# Patient Record
Sex: Female | Born: 2000 | Race: Black or African American | Hispanic: No | Marital: Single | Smoking: Never smoker
Health system: Southern US, Community
[De-identification: ages and names within clinical notes are randomized; demographics above are authoritative.]

## PROBLEM LIST (undated history)

## (undated) DIAGNOSIS — U071 COVID-19: Secondary | ICD-10-CM

## (undated) DIAGNOSIS — J45909 Unspecified asthma, uncomplicated: Secondary | ICD-10-CM

## (undated) DIAGNOSIS — S060XAA Concussion with loss of consciousness status unknown, initial encounter: Secondary | ICD-10-CM

## (undated) DIAGNOSIS — R519 Headache, unspecified: Secondary | ICD-10-CM

## (undated) HISTORY — DX: COVID-19: U07.1

## (undated) HISTORY — DX: Unspecified asthma, uncomplicated: J45.909

## (undated) HISTORY — DX: Headache, unspecified: R51.9

## (undated) HISTORY — DX: Concussion with loss of consciousness status unknown, initial encounter: S06.0XAA

---
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
## 2019-03-03 ENCOUNTER — Other Ambulatory Visit: Payer: Self-pay

## 2019-03-03 DIAGNOSIS — Z20822 Contact with and (suspected) exposure to covid-19: Secondary | ICD-10-CM

## 2019-03-07 LAB — NOVEL CORONAVIRUS, NAA: SARS-CoV-2, NAA: NOT DETECTED

## 2019-03-18 ENCOUNTER — Ambulatory Visit: Payer: Self-pay | Admitting: Physician Assistant

## 2019-03-18 ENCOUNTER — Other Ambulatory Visit: Payer: Self-pay

## 2019-03-18 VITALS — BP 95/65 | HR 66 | Temp 98.3°F | Resp 14 | Ht 65.5 in | Wt 133.0 lb

## 2019-03-18 DIAGNOSIS — Z025 Encounter for examination for participation in sport: Secondary | ICD-10-CM

## 2019-03-18 NOTE — Patient Instructions (Signed)
Thank you for choosing [facility] for your health care needs.  You were seen for a SPORTS PHYSICAL  Paperwork completed.  No restrictions.  Enjoy your season.   Recommend regular follow-up with a family physician for health maintenance. Hope you have a good day.  Health Maintenance, Female Adopting a healthy lifestyle and getting preventive care are important in promoting health and wellness. Ask your health care provider about:  The right schedule for you to have regular tests and exams.  Things you can do on your own to prevent diseases and keep yourself healthy. What should I know about diet, weight, and exercise? Eat a healthy diet   Eat a diet that includes plenty of vegetables, fruits, low-fat dairy products, and lean protein.  Do not eat a lot of foods that are high in solid fats, added sugars, or sodium. Maintain a healthy weight Body mass index (BMI) is used to identify weight problems. It estimates body fat based on height and weight. Your health care provider can help determine your BMI and help you achieve or maintain a healthy weight. Get regular exercise Get regular exercise. This is one of the most important things you can do for your health. Most adults should:  Exercise for at least 150 minutes each week. The exercise should increase your heart rate and make you sweat (moderate-intensity exercise).  Do strengthening exercises at least twice a week. This is in addition to the moderate-intensity exercise.  Spend less time sitting. Even light physical activity can be beneficial. Watch cholesterol and blood lipids Have your blood tested for lipids and cholesterol at 20 years of age, then have this test every 5 years. Have your cholesterol levels checked more often if:  Your lipid or cholesterol levels are high.  You are older than 20 years of age.  You are at high risk for heart disease. What should I know about cancer screening? Depending on your health  history and family history, you may need to have cancer screening at various ages. This may include screening for:  Breast cancer.  Cervical cancer.  Colorectal cancer.  Skin cancer.  Lung cancer. What should I know about heart disease, diabetes, and high blood pressure? Blood pressure and heart disease  High blood pressure causes heart disease and increases the risk of stroke. This is more likely to develop in people who have high blood pressure readings, are of African descent, or are overweight.  Have your blood pressure checked: ? Every 3-5 years if you are 5818-20 years of age. ? Every year if you are 20 years old or older. Diabetes Have regular diabetes screenings. This checks your fasting blood sugar level. Have the screening done:  Once every three years after age 20 if you are at a normal weight and have a low risk for diabetes.  More often and at a younger age if you are overweight or have a high risk for diabetes. What should I know about preventing infection? Hepatitis B If you have a higher risk for hepatitis B, you should be screened for this virus. Talk with your health care provider to find out if you are at risk for hepatitis B infection. Hepatitis C Testing is recommended for:  Everyone born from [birth year]1945 through 1965.  Anyone with known risk factors for hepatitis C. Sexually transmitted infections (STIs)  Get screened for STIs, including gonorrhea and chlamydia, if: ? You are sexually active and are younger than 20 years of age. ? You are older than 20  years of age and your health care provider tells you that you are at risk for this type of infection. ? Your sexual activity has changed since you were last screened, and you are at increased risk for chlamydia or gonorrhea. Ask your health care provider if you are at risk.  Ask your health care provider about whether you are at high risk for HIV. Your health care provider may recommend a prescription medicine to  help prevent HIV infection. If you choose to take medicine to prevent HIV, you should first get tested for HIV. You should then be tested every 3 months for as long as you are taking the medicine. Pregnancy  If you are about to stop having your period (premenopausal) and you may become pregnant, seek counseling before you get pregnant.  Take 400 to 800 micrograms (mcg) of folic acid every day if you become pregnant.  Ask for birth control (contraception) if you want to prevent pregnancy. Osteoporosis and menopause Osteoporosis is a disease in which the bones lose minerals and strength with aging. This can result in bone fractures. If you are 92 years old or older, or if you are at risk for osteoporosis and fractures, ask your health care provider if you should:  Be screened for bone loss.  Take a calcium or vitamin D supplement to lower your risk of fractures.  Be given hormone replacement therapy (HRT) to treat symptoms of menopause. Follow these instructions at home: Lifestyle  Do not use any products that contain nicotine or tobacco, such as cigarettes, e-cigarettes, and chewing tobacco. If you need help quitting, ask your health care provider.  Do not use street drugs.  Do not share needles.  Ask your health care provider for help if you need support or information about quitting drugs. Alcohol use  Do not drink alcohol if: ? Your health care provider tells you not to drink. ? You are pregnant, may be pregnant, or are planning to become pregnant.  If you drink alcohol: ? Limit how much you use to 0-1 drink a day. ? Limit intake if you are breastfeeding.  Be aware of how much alcohol is in your drink. In the [place] one drink equals one 12 oz bottle of beer (355 mL), one 5 oz glass of wine (148 mL), or one 1 oz glass of hard liquor (44 mL). General instructions  Schedule regular health, dental, and eye exams.  Stay current with your vaccines.  Tell your health care  provider if: ? You often feel depressed. ? You have ever been abused or do not feel safe at home. Summary  Adopting a healthy lifestyle and getting preventive care are important in promoting health and wellness.  Follow your health care provider's instructions about healthy diet, exercising, and getting tested or screened for diseases.  Follow your health care provider's instructions on monitoring your cholesterol and blood pressure. This information is not intended to replace advice given to you by your health care provider. Make sure you discuss any questions you have with your health care provider. Document Released: 02/18/2011 Document Revised: 07/29/2018 Document Reviewed: 07/29/2018 Elsevier Patient Education  2020 [employer].

## 2019-03-18 NOTE — Progress Notes (Signed)
Patient ID: [name] DOB: 2001/05/13 AGE: 18 y.o. MRN: [id number]   PCP: No primary care provider on file.   Chief Complaint:  Chief Complaint  Patient presents with  . Annual Exam     Subjective:    HPI:  Sophia Benitez is a 18 y.o. female presents for evaluation  Chief Complaint  Patient presents with  . Annual Exam   18 year old female presents to [facility] with request for sports physical. 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   Does not have PCP.  Patient has albuterol inhaler. Diagnosis of exercise induced asthma. Uses albuterol inhaler very rarely. Only on conditioning days. Does not use regularly, prior to every practice, or prior to every game. Denies asthma exacerbation for several years.  Patient with two previous concussions. One junior year of highschool and one this past Jan/Feb 2020. Both basketball injuries. Out of play for 2-3 weeks with first concussion. Out of play for 1 week with 2nd concussion. Patient never seen by neurologist or concussion specialist. Returned to play via athletic trainers. Patient states she will now develop headaches when she works-out/exercises very hard. Resolves with ibuprofen.  No additional medical history. Denies cardiac history. No hernia history. No complaints today.  A limited review of symptoms was performed, pertinent positives and negatives as mentioned in HPI.  The following portions of the patient's history were reviewed and updated as appropriate: allergies, current medications and past medical history.  There are no active problems to display for this patient.   No Known Allergies  Current Outpatient Medications on File Prior to Visit  Medication Sig Dispense Refill  . ibuprofen (ADVIL,MOTRIN) 600 MG tablet Take 1 tablet (600 mg total) by mouth every 6 (six) hours as needed. (Patient not taking: Reported on 03/18/2019) 30 tablet 0   No current facility-administered medications on  file prior to visit.        Objective:   Vitals:   03/18/19 1241  BP: 95/65  Pulse: 66  Resp: 14  Temp: 98.3 F (36.8 C)  SpO2: 98%     Wt Readings from Last 3 Encounters:  03/18/19 133 lb (60.3 kg) (58 %, Z= 0.21)*  12/28/15 147 lb (66.7 kg) (84 %, Z= 1.01)*   * Growth percentiles are based on CDC (Girls, 2-20 Years) data.    Physical Exam:  Vision screening: Right eye 20/40 Left eye 20/50 Both eyes 20/30 With correction (wearing contact lenses).  Patient reports using old contact lenses. Has up to date prescription. Will use for school.   General Appearance:  Patient sitting comfortably on examination table. Conversational. Sophia Benitez self-historian. In no acute distress. Afebrile.   Head:  Normocephalic, without obvious abnormality, atraumatic  Eyes:  PERRL, conjunctiva/corneas clear, EOM's intact  Ears:  Left ear canal WNL. No erythema or edema. No open wound. No visible purulent drainage. No tenderness with palpation over left tragus or with manipulation of left auricle. No visible erythema or edema of left mastoid. No tenderness with palpation over left mastoid. Right ear canal WNL. No erythema or edema. No open wound. No visible purulent drainage. No tenderness with palpation over right tragus or with manipulation of right auricle. No visible erythema or edema of right mastoid. No tenderness with palpation over right mastoid. Left TM WNL. Good light reflex. Visible landmarks. No erythema. No injection. No bulging or retraction. No visible perforation. No serous effusion. No visible purulent effusion. No tympanostomy tube. No scar tissue. Right TM WNL. Good light  reflex. Visible landmarks. No erythema. No injection. No bulging or retraction. No visible perforation. No serous effusion. No visible purulent effusion. No tympanostomy tube. No scar tissue.  Nose: Nares normal. Septum midline. No visible polyps. No discharge. Normal mucosa. No sinus tenderness with  percussion/palpation.  Throat: Lips, mucosa, and tongue normal; teeth and gums normal. Throat reveals no erythema. No postnasal drip. No visible cobblestoning. Tonsils with no enlargement or exudate. Uvula midline with no edema or erythema.  Neck: Supple, symmetrical, trachea midline, no adenopathy  Lungs:   Clear to auscultation bilaterally, respirations unlabored. Good aeration. No rales, rhonchi, crackles or wheezing.  Heart:  Regular rate and rhythm, S1 and S2 normal, no murmur, rub, or gallop  Abdomen:   Normal to inspection. Normoactive bowel sounds. No tenderness with palpation. No guarding, rigidity or rebound tenderness. No palpable organomegaly.  Extremities: Extremities normal, atraumatic, no cyanosis or edema 5/5 upper and lower extremity strength. Finger to nose WNL. RAMs WNL. Able to squat without difficulty. Able to flex at waist, touch toes, no evidence of scoliosis. No gait abnormality.  Pulses: 2+ and symmetric  Skin: Skin color, texture, turgor normal, no rashes or lesions  Lymph nodes: Cervical, supraclavicular, and axillary nodes normal  Neurologic: Normal    Assessment & Plan:    Exam findings, diagnosis etiology and medication use and indications reviewed with patient. Follow-Up and discharge instructions provided. No emergent/urgent issues found on exam.  Patient education was provided.   Patient verbalized understanding of information provided and agrees with plan of care (POC), all questions answered. The patient is advised to call or return to clinic if condition does not see an improvement in symptoms, or to seek the care of the closest emergency department if condition worsens with the below plan.    1. Sports physical  18 year old female presents with request for sports physical. Reviewed medical history; not disqualifying. VSS. Hearing/vision WNL (advised using up to date rx contact lenses). Benign physical examination. Paperwork completed. Cleared for  athletics with no restrictions. Advised establishing care with PCP for continued health maintenance.   Janalyn HarderSamantha Alinda Egolf, MHS, PA-C Rulon SeraSamantha F. Dakayla Disanti, MHS, PA-C Advanced Practice Provider [facility]  [facility]  [phone] [place], [place] [address] (p): [phone] [email or web address]

## 2019-12-16 ENCOUNTER — Other Ambulatory Visit: Payer: Self-pay

## 2019-12-16 ENCOUNTER — Ambulatory Visit (INDEPENDENT_AMBULATORY_CARE_PROVIDER_SITE_OTHER): Payer: No Typology Code available for payment source | Admitting: Internal Medicine

## 2019-12-16 ENCOUNTER — Encounter: Payer: Self-pay | Admitting: Internal Medicine

## 2019-12-16 VITALS — BP 110/88 | HR 70 | Temp 97.5°F | Resp 15 | Ht 65.5 in | Wt 148.2 lb

## 2019-12-16 DIAGNOSIS — Z1331 Encounter for screening for depression: Secondary | ICD-10-CM

## 2019-12-16 DIAGNOSIS — S060X9A Concussion with loss of consciousness of unspecified duration, initial encounter: Secondary | ICD-10-CM | POA: Diagnosis not present

## 2019-12-16 DIAGNOSIS — S098XXA Other specified injuries of head, initial encounter: Secondary | ICD-10-CM

## 2019-12-16 DIAGNOSIS — D649 Anemia, unspecified: Secondary | ICD-10-CM

## 2019-12-16 DIAGNOSIS — Z789 Other specified health status: Secondary | ICD-10-CM

## 2019-12-16 DIAGNOSIS — D519 Vitamin B12 deficiency anemia, unspecified: Secondary | ICD-10-CM

## 2019-12-16 LAB — CBC WITH DIFFERENTIAL/PLATELET
Basophils Absolute: 0 10*3/uL (ref 0.0–0.1)
Basophils Relative: 0.4 % (ref 0.0–3.0)
Eosinophils Absolute: 0.1 10*3/uL (ref 0.0–0.7)
Eosinophils Relative: 1.6 % (ref 0.0–5.0)
HCT: 33.6 % — ABNORMAL LOW (ref 36.0–46.0)
Hemoglobin: 10.6 g/dL — ABNORMAL LOW (ref 12.0–15.0)
Lymphocytes Relative: 24.6 % (ref 12.0–46.0)
Lymphs Abs: 1.5 10*3/uL (ref 0.7–4.0)
MCHC: 31.5 g/dL (ref 30.0–36.0)
MCV: 79.7 fl (ref 78.0–100.0)
Monocytes Absolute: 0.3 10*3/uL (ref 0.1–1.0)
Monocytes Relative: 5.7 % (ref 3.0–12.0)
Neutro Abs: 4.1 10*3/uL (ref 1.4–7.7)
Neutrophils Relative %: 67.7 % (ref 43.0–77.0)
Platelets: 222 10*3/uL (ref 150.0–400.0)
RBC: 4.22 Mil/uL (ref 3.87–5.11)
RDW: 15.5 % — ABNORMAL HIGH (ref 11.5–14.6)
WBC: 6.1 10*3/uL (ref 4.5–10.5)

## 2019-12-16 LAB — COMPREHENSIVE METABOLIC PANEL
ALT: 12 U/L (ref 0–35)
AST: 17 U/L (ref 0–37)
Albumin: 4.2 g/dL (ref 3.5–5.2)
Alkaline Phosphatase: 41 U/L (ref 39–117)
BUN: 13 mg/dL (ref 6–23)
CO2: 31 mEq/L (ref 19–32)
Calcium: 8.8 mg/dL (ref 8.4–10.5)
Chloride: 104 mEq/L (ref 96–112)
Creatinine, Ser: 0.68 mg/dL (ref 0.40–1.20)
GFR: 132.53 mL/min (ref >60.00)
Glucose, Bld: 86 mg/dL (ref 70–99)
Potassium: 3.9 mEq/L (ref 3.5–5.1)
Sodium: 139 mEq/L (ref 135–145)
Total Bilirubin: 0.6 mg/dL (ref 0.2–1.2)
Total Protein: 6.4 g/dL (ref 6.0–8.3)

## 2019-12-16 LAB — TSH: TSH: 1.37 u[IU]/mL (ref 0.35–5.50)

## 2019-12-16 LAB — B12 AND FOLATE PANEL
Folate: 19.4 ng/mL (ref >5.9)
Vitamin B-12: 294 pg/mL (ref 211–911)

## 2019-12-16 NOTE — Patient Instructions (Addendum)
Nice to meet you!   Continue Brain rest for 2 weeks to allow concussion to resolve   You should get a PAP smear at your next visit in 6 months   Bruising is often due to unrecognized trauma to skin,  Or poor skin integrity.  Unless it is accompanied by bleeding or low platelets,  It is not serious   You could try taking a Hair Skin and nails multivitamin  Which provides biotin , collagen and vitamin D    When you return to workouts , try using an electrolyte replacement drink instead of water.  It may prevent your headaches if they are coming from low sodium

## 2019-12-16 NOTE — Progress Notes (Signed)
Subjective:  Patient ID: Sophia Benitez, female    DOB: 10-Feb-2001  Age: 19 y.o. MRN: [id number]  CC: The primary encounter diagnosis was Anemia, unspecified type. Diagnoses of Blunt head injury, initial encounter, Positive depression screening, Concussion with < 1 hr loss of consciousness, Vegetarian diet, and Anemia due to vitamin B12 deficiency, unspecified B12 deficiency type were also pertinent to this visit.  HPI Sophia Benitez presents for establishment of care.  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   On the day prior to the incident she was treated  in Urgent care  for dizziness and one episode of emesis  .  Diagnosed iron deficiency anemia, (chronic per patient for the past 1.5 years)   Periods are not heavy.  Occurring regularly  No serious cramping.  .  Patient has been vegetarian for 2-3 years.  No B12 checked in the past.   Head to head collision :  Resulted in  LOC for 5 min.  She treated for concussion head  CT was done at OSH  On April 21. Headache improving.  Has been sidelined from basketball practice for 2 weeks by coach  Wears contacts  No prior PAP  Smear.  Last sexual encounter 5 months ago.  NOt using oral contraception    History Sophia Benitez has a past medical history of Asthma and Frequent headaches.   She has no past surgical history on file.   Her family history includes Asthma in her mother.She reports that she has never smoked. She has never used smokeless tobacco. She reports that she does not use drugs. No history on file for alcohol.          Outpatient Medications Prior to Visit  Medication Sig Dispense Refill  . albuterol (PROVENTIL) (2.5 MG/3ML) 0.083% nebulizer solution Take 2.5 mg by nebulization every 6 (six) hours as needed for wheezing or shortness of breath.    Marland Kitchen ibuprofen (ADVIL,MOTRIN)  600 MG tablet Take 1 tablet (600 mg total) by mouth every 6 (six) hours as needed. 30 tablet 0   No facility-administered medications prior to visit.    Review of Systems:  Patient denies  fevers, malaise, unintentional weight loss, skin rash, eye pain, sinus congestion and sinus pain, sore throat, dysphagia,  hemoptysis , cough, dyspnea, wheezing, chest pain, palpitations, orthopnea, edema, abdominal pain, nausea, melena, diarrhea, constipation, flank pain, dysuria, hematuria, urinary  Frequency, nocturia, numbness, tingling, seizures,  Focal weakness, Loss of consciousness,  Tremor, insomnia, depression, anxiety, and suicidal ideation.     Objective:  BP 110/88 (BP Location: Left Arm, Patient Position: Sitting, Cuff Size: Normal)   Pulse 70   Temp (!) 97.5 F (36.4 C) (Temporal)   Resp 15   Ht 5' 5.5" (1.664 m)   Wt 148 lb 3.2 oz (67.2 kg)   LMP 12/03/2019 (Exact Date)   SpO2 99%   BMI 24.29 kg/m   Physical Exam:  General appearance: alert, cooperative and appears stated age Ears: normal TM's and external ear canals both ears Throat: lips, mucosa, and tongue normal; teeth and gums normal Neck: no adenopathy, no carotid bruit, supple, symmetrical, trachea midline and thyroid not enlarged, symmetric, no tenderness/mass/nodules Back: symmetric, no curvature. ROM normal. No CVA tenderness. Lungs: clear to auscultation bilaterally Heart: regular rate and rhythm, S1, S2 normal, no murmur, click, rub or gallop Abdomen: soft, non-tender;  bowel sounds normal; no masses,  no organomegaly Pulses: 2+ and symmetric Skin: Skin color, texture, turgor normal. No rashes or lesions Lymph nodes: Cervical, supraclavicular, and axillary nodes normal. Neuro: CNs 2-12 intact. DTRs 2+/4 in biceps, brachioradialis, patellars and achilles. Muscle strength 5/5 in upper and lower exremities. Fine resting tremor bilaterally both hands cerebellar function normal. Romberg negative.  No pronator drift.   Gait  normal.     Assessment & Plan:   Problem List Items Addressed This Visit      Unprioritized   Anemia - Primary    She appears to be both iron and b12 deficient,  With vegetarian diet and normal menses per history..  Will replace both and reassess in 6 weeks . Per mother she was screened for sickle cell anemia 1.5 years ago during college entrance evaluation   Lab Results  Component Value Date   WBC 6.1 12/16/2019   HGB 10.6 (L) 12/16/2019   HCT 33.6 (L) 12/16/2019   MCV 79.7 12/16/2019   PLT 222.0 12/16/2019         Relevant Orders   B12 and Folate Panel (Completed)   Iron, TIBC and Ferritin Panel (Completed)   CBC with Differential/Platelet (Completed)   TSH (Completed)   Comprehensive metabolic panel (Completed)   Blunt head injury, initial encounter    Resulting in LOC and concussion injury.  Brain rest advised for 2 weeks.  Neuro exam normal       Concussion with < 1 hr loss of consciousness    Occurred April 21 after collision during basketball play.  LOC was 5 minutes.  Patient was treated at OSH and CT head reportedly done. Records requested. Her neurologic exam is normal       Positive depression screening    Patient denies chronic depressive symptoms during exam but her written depression screen was positive.  Unclear if symptoms are due to recent concussion , aggravated by anemia which may be causing fatigue.  Will address all physical issues and follow up in one month       Vegetarian diet      I am having Sophia Benitez maintain her ibuprofen and albuterol.  No orders of the defined types were placed in this encounter.   There are no discontinued medications.  Follow-up: Return in about 6 months (around 06/16/2020).   Sherlene Shams, MD

## 2019-12-17 LAB — IRON,TIBC AND FERRITIN PANEL
%SAT: 15 % (calc) — ABNORMAL LOW (ref 16–45)
Ferritin: 4 ng/mL — ABNORMAL LOW (ref 16–154)
Iron: 61 ug/dL (ref 40–190)
TIBC: 402 mcg/dL (calc) (ref 250–450)

## 2019-12-18 DIAGNOSIS — S060X9A Concussion with loss of consciousness of unspecified duration, initial encounter: Secondary | ICD-10-CM

## 2019-12-18 DIAGNOSIS — Z1331 Encounter for screening for depression: Secondary | ICD-10-CM | POA: Insufficient documentation

## 2019-12-18 DIAGNOSIS — Z789 Other specified health status: Secondary | ICD-10-CM | POA: Insufficient documentation

## 2019-12-18 DIAGNOSIS — S098XXA Other specified injuries of head, initial encounter: Secondary | ICD-10-CM | POA: Insufficient documentation

## 2019-12-18 DIAGNOSIS — D649 Anemia, unspecified: Secondary | ICD-10-CM | POA: Insufficient documentation

## 2019-12-18 HISTORY — DX: Concussion with loss of consciousness of unspecified duration, initial encounter: S06.0X9A

## 2019-12-18 NOTE — Assessment & Plan Note (Signed)
Patient denies chronic depressive symptoms during exam but her written depression screen was positive.  Unclear if symptoms are due to recent concussion , aggravated by anemia which may be causing fatigue.  Will address all physical issues and follow up in one month

## 2019-12-18 NOTE — Assessment & Plan Note (Signed)
Resulting in LOC and concussion injury.  Brain rest advised for 2 weeks.  Neuro exam normal
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
## 2019-12-18 NOTE — Assessment & Plan Note (Addendum)
She appears to be both iron and b12 deficient,  With vegetarian diet and normal menses per history..  Will replace both and reassess in 6 weeks . Per mother she was screened for sickle cell anemia 1.5 years ago during college entrance evaluation   Lab Results  Component Value Date   WBC 6.1 12/16/2019   HGB 10.6 (L) 12/16/2019   HCT 33.6 (L) 12/16/2019   MCV 79.7 12/16/2019   PLT 222.0 12/16/2019

## 2019-12-20 ENCOUNTER — Telehealth: Payer: Self-pay

## 2019-12-20 DIAGNOSIS — D519 Vitamin B12 deficiency anemia, unspecified: Secondary | ICD-10-CM

## 2019-12-20 NOTE — Telephone Encounter (Signed)
Lab ordered for lab appt on 12/22/2019.

## 2019-12-22 ENCOUNTER — Ambulatory Visit (INDEPENDENT_AMBULATORY_CARE_PROVIDER_SITE_OTHER): Payer: No Typology Code available for payment source

## 2019-12-22 ENCOUNTER — Other Ambulatory Visit (INDEPENDENT_AMBULATORY_CARE_PROVIDER_SITE_OTHER): Payer: No Typology Code available for payment source

## 2019-12-22 ENCOUNTER — Other Ambulatory Visit: Payer: Self-pay

## 2019-12-22 ENCOUNTER — Ambulatory Visit: Payer: Self-pay

## 2019-12-22 DIAGNOSIS — D519 Vitamin B12 deficiency anemia, unspecified: Secondary | ICD-10-CM | POA: Diagnosis not present

## 2019-12-22 MED ORDER — CYANOCOBALAMIN 1000 MCG/ML IJ SOLN
1000.0000 ug | Freq: Once | INTRAMUSCULAR | Status: AC
  Administered 2019-12-22: 1000 ug via INTRAMUSCULAR

## 2019-12-22 NOTE — Progress Notes (Signed)
Patient presented for B 12 injection to left deltoid, patient voiced no concerns nor showed any signs of distress during injection. 

## 2019-12-23 ENCOUNTER — Other Ambulatory Visit: Payer: Self-pay

## 2019-12-23 MED ORDER — CYANOCOBALAMIN 1000 MCG/ML IJ SOLN: INTRAMUSCULAR | 1 refills | Status: DC

## 2019-12-24 ENCOUNTER — Ambulatory Visit: Payer: No Typology Code available for payment source | Attending: Internal Medicine

## 2019-12-24 DIAGNOSIS — Z23 Encounter for immunization: Secondary | ICD-10-CM

## 2019-12-24 LAB — INTRINSIC FACTOR ANTIBODIES: Intrinsic Factor: NEGATIVE

## 2019-12-24 NOTE — Progress Notes (Signed)
   Covid-19 Vaccination Clinic  Name:  Sophia Benitez    MRN: [id number] DOB: July 05, 2001  12/24/2019  [name] was observed post Covid-19 immunization for 15 minutes without incident. She was provided with Vaccine Information Sheet and instruction to access the V-Safe system.   [name] was instructed to call 911 with any severe reactions post vaccine: Marland Kitchen Difficulty breathing  . Swelling of face and throat  . A fast heartbeat  . A bad rash all over body  . Dizziness and weakness   Immunizations Administered    Name Date Dose VIS Date Route   Pfizer COVID-19 Vaccine 12/24/2019 11:10 AM 0.3 mL 10/13/2018 Intramuscular   Manufacturer: [employer], [employer]   Lot: [id number]   NDC: [address]-5

## 2020-01-14 ENCOUNTER — Ambulatory Visit: Payer: No Typology Code available for payment source

## 2020-01-20 ENCOUNTER — Ambulatory Visit: Payer: No Typology Code available for payment source | Attending: Internal Medicine

## 2020-01-20 ENCOUNTER — Other Ambulatory Visit: Payer: Self-pay

## 2020-01-20 DIAGNOSIS — Z23 Encounter for immunization: Secondary | ICD-10-CM

## 2020-01-20 NOTE — Progress Notes (Signed)
   Covid-19 Vaccination Clinic  Name:  Sophia Benitez    MRN: [id number] DOB: 2001/07/09  01/20/2020  Sophia Benitez was observed post Covid-19 immunization for 15 minutes without incident. She was provided with Vaccine Information Sheet and instruction to access the V-Safe system.   Sophia Benitez was instructed to call 911 with any severe reactions post vaccine: Marland Kitchen Difficulty breathing  . Swelling of face and throat  . A fast heartbeat  . A bad rash all over body  . Dizziness and weakness   Immunizations Administered    Name Date Dose VIS Date Route   Pfizer COVID-19 Vaccine 01/20/2020  8:06 AM 0.3 mL 10/13/2018 Intramuscular   Manufacturer: [employer], [employer]   Lot: [id number]   NDC: [address]-8

## 2020-01-24 ENCOUNTER — Telehealth (INDEPENDENT_AMBULATORY_CARE_PROVIDER_SITE_OTHER): Payer: No Typology Code available for payment source | Admitting: Internal Medicine

## 2020-01-24 ENCOUNTER — Encounter: Payer: Self-pay | Admitting: Internal Medicine

## 2020-01-24 VITALS — Ht 65.5 in | Wt 138.0 lb

## 2020-01-24 DIAGNOSIS — Z1331 Encounter for screening for depression: Secondary | ICD-10-CM | POA: Diagnosis not present

## 2020-01-24 DIAGNOSIS — D6489 Other specified anemias: Secondary | ICD-10-CM

## 2020-01-24 DIAGNOSIS — Z91410 Personal history of adult physical and sexual abuse: Secondary | ICD-10-CM | POA: Diagnosis not present

## 2020-01-24 DIAGNOSIS — D519 Vitamin B12 deficiency anemia, unspecified: Secondary | ICD-10-CM | POA: Diagnosis not present

## 2020-01-24 DIAGNOSIS — R634 Abnormal weight loss: Secondary | ICD-10-CM | POA: Insufficient documentation

## 2020-01-24 MED ORDER — MIRTAZAPINE 15 MG PO TABS: ORAL_TABLET | ORAL | 2 refills | Status: DC

## 2020-01-24 NOTE — Assessment & Plan Note (Signed)
She appears to be both iron and b12 deficient,  With vegetarian diet and normal menses per history..  She has been taking iron during her menses,  And has finished V12 ijections and is now taking an oral supplement. . Per mother she was screened for sickle cell anemia 1.5 years ago during college entrance evaluation   Lab Results  Component Value Date   WBC 6.1 12/16/2019   HGB 10.6 (L) 12/16/2019   HCT 33.6 (L) 12/16/2019   MCV 79.7 12/16/2019   PLT 222.0 12/16/2019

## 2020-01-24 NOTE — Addendum Note (Signed)
Addended by: Bonnell Public I on: 01/24/2020 09:33 AM   Modules accepted: Orders
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
## 2020-01-24 NOTE — Patient Instructions (Signed)
I am prescribing 7.5 mg of Remeron to help relieve your depression   Take 1/2 tablet daily when yo get home from work.  The office will call you to set up a follow up appointment with me in around 2 weeks  We will repeat your anemia labs either at the appointment or before (if possible)  PLEASE start drinking a protein shake as your 2nd meal (unless you develop  A hopefully bigger appetite soon!)
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
## 2020-01-24 NOTE — Assessment & Plan Note (Signed)
Secondary to depression.  Starting remeron.  Encouraged to drink a protein shake instead of skipping breakfast.
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
## 2020-02-09 ENCOUNTER — Other Ambulatory Visit: Payer: Self-pay

## 2020-02-09 ENCOUNTER — Encounter: Payer: Self-pay | Admitting: Internal Medicine

## 2020-02-09 ENCOUNTER — Ambulatory Visit (INDEPENDENT_AMBULATORY_CARE_PROVIDER_SITE_OTHER): Payer: No Typology Code available for payment source | Admitting: Internal Medicine

## 2020-02-09 VITALS — BP 96/70 | HR 70 | Temp 98.4°F | Resp 16 | Ht 65.5 in | Wt 138.6 lb

## 2020-02-09 DIAGNOSIS — R634 Abnormal weight loss: Secondary | ICD-10-CM

## 2020-02-09 DIAGNOSIS — Z1331 Encounter for screening for depression: Secondary | ICD-10-CM | POA: Diagnosis not present

## 2020-02-09 DIAGNOSIS — D519 Vitamin B12 deficiency anemia, unspecified: Secondary | ICD-10-CM | POA: Diagnosis not present

## 2020-02-09 DIAGNOSIS — Z91410 Personal history of adult physical and sexual abuse: Secondary | ICD-10-CM | POA: Diagnosis not present
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
## 2020-02-09 NOTE — Patient Instructions (Signed)
  I'm glad you are feeling  Better   Continue your current medications for now.  You can increase the mirtazapine to 15 mg daily before bedtime if you feel you need to; just let me know.   I have made a referral to a counsellor for talk therapy;  If you do not hear from anyone in a week , please let me know

## 2020-02-11 MED ORDER — VITAMIN B-12 1000 MCG PO TABS: 1000.0000 ug | ORAL_TABLET | Freq: Every day | ORAL | 3 refills | Status: DC

## 2020-02-11 NOTE — Assessment & Plan Note (Addendum)
She is  both iron and b12 deficient,  With vegetarian diet and normal menses per history..  She has been taking iron during her menses,  And has finished B12 injections and is now taking an oral supplement. . Per mother she was screened for sickle cell anemia 1.5 years ago during college entrance evaluation . Will repeat labs in late July   Lab Results  Component Value Date   WBC 6.1 12/16/2019   HGB 10.6 (L) 12/16/2019   HCT 33.6 (L) 12/16/2019   MCV 79.7 12/16/2019   PLT 222.0 12/16/2019   Lab Results  Component Value Date   VITAMINB12 294 12/16/2019   Lab Results  Component Value Date   IRON 61 12/16/2019   TIBC 402 12/16/2019   FERRITIN 4 (L) 12/16/2019

## 2020-02-11 NOTE — Assessment & Plan Note (Signed)
Secondary to depression.  Weight has stabilized after 2 weeks of mirtazipine 7.5 mg daily . Continue current medication; encouraged to increase dose to 15 mg when tolerated.

## 2020-03-20 ENCOUNTER — Ambulatory Visit: Payer: No Typology Code available for payment source | Admitting: Psychology

## 2020-03-27 ENCOUNTER — Other Ambulatory Visit (INDEPENDENT_AMBULATORY_CARE_PROVIDER_SITE_OTHER): Payer: No Typology Code available for payment source

## 2020-03-27 ENCOUNTER — Other Ambulatory Visit: Payer: Self-pay

## 2020-03-27 DIAGNOSIS — D519 Vitamin B12 deficiency anemia, unspecified: Secondary | ICD-10-CM | POA: Diagnosis not present

## 2020-03-27 DIAGNOSIS — D6489 Other specified anemias: Secondary | ICD-10-CM

## 2020-03-27 LAB — CBC WITH DIFFERENTIAL/PLATELET
Basophils Absolute: 0 10*3/uL (ref 0.0–0.1)
Basophils Relative: 0.3 % (ref 0.0–3.0)
Eosinophils Absolute: 0.1 10*3/uL (ref 0.0–0.7)
Eosinophils Relative: 1 % (ref 0.0–5.0)
HCT: 32.6 % — ABNORMAL LOW (ref 36.0–46.0)
Hemoglobin: 10.7 g/dL — ABNORMAL LOW (ref 12.0–15.0)
Lymphocytes Relative: 35.8 % (ref 12.0–46.0)
Lymphs Abs: 1.9 10*3/uL (ref 0.7–4.0)
MCHC: 32.8 g/dL (ref 30.0–36.0)
MCV: 81.6 fl (ref 78.0–100.0)
Monocytes Absolute: 0.5 10*3/uL (ref 0.1–1.0)
Monocytes Relative: 9.6 % (ref 3.0–12.0)
Neutro Abs: 2.9 10*3/uL (ref 1.4–7.7)
Neutrophils Relative %: 53.3 % (ref 43.0–77.0)
Platelets: 198 10*3/uL (ref 150.0–400.0)
RBC: 4 Mil/uL (ref 3.87–5.11)
RDW: 15.8 % — ABNORMAL HIGH (ref 11.5–14.6)
WBC: 5.4 10*3/uL (ref 4.5–10.5)

## 2020-03-27 LAB — VITAMIN B12: Vitamin B-12: 428 pg/mL (ref 211–911)

## 2020-03-27 NOTE — Addendum Note (Signed)
Addended by: Bonnell Public I on: 03/27/2020 08:12 AM   Modules accepted: Orders

## 2020-03-28 ENCOUNTER — Ambulatory Visit (INDEPENDENT_AMBULATORY_CARE_PROVIDER_SITE_OTHER): Payer: No Typology Code available for payment source | Admitting: Internal Medicine

## 2020-03-28 ENCOUNTER — Encounter: Payer: Self-pay | Admitting: Internal Medicine

## 2020-03-28 DIAGNOSIS — Z0001 Encounter for general adult medical examination with abnormal findings: Secondary | ICD-10-CM

## 2020-03-28 DIAGNOSIS — Z1331 Encounter for screening for depression: Secondary | ICD-10-CM | POA: Diagnosis not present

## 2020-03-28 DIAGNOSIS — S060X9A Concussion with loss of consciousness of unspecified duration, initial encounter: Secondary | ICD-10-CM | POA: Diagnosis not present

## 2020-03-28 DIAGNOSIS — D519 Vitamin B12 deficiency anemia, unspecified: Secondary | ICD-10-CM | POA: Diagnosis not present

## 2020-03-28 DIAGNOSIS — Z0000 Encounter for general adult medical examination without abnormal findings: Secondary | ICD-10-CM | POA: Insufficient documentation

## 2020-03-28 LAB — IRON,TIBC AND FERRITIN PANEL
Ferritin: 9 ng/mL — ABNORMAL LOW (ref 15–150)
Iron Saturation: 31 % (ref 15–55)
Iron: 99 ug/dL (ref 27–159)
Total Iron Binding Capacity: 319 ug/dL (ref 250–450)
UIBC: 220 ug/dL (ref 131–425)

## 2020-03-28 NOTE — Assessment & Plan Note (Signed)
Multifactorial,  With b12 and iron deficiencies .  Increase iron to daily if tolerated,  And resume b12 oral medications

## 2020-03-28 NOTE — Patient Instructions (Signed)
You are still anemic, so:   Increase iron to daily if  Tolerated.   If not,  Take it Every other day.    Take colace 100 mg or 200 mg every night to prevent constipation  Continue b12 gummy bears .
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
## 2020-03-28 NOTE — Assessment & Plan Note (Signed)
Feeling better on remeron.  Weight stable .

## 2020-03-28 NOTE — Progress Notes (Signed)
Patient ID: Sophia Benitez, female    DOB: 03/24/2001  Age: 19 y.o. MRN: [id number]  The patient is here for annual school  examination and management of other chronic and acute problems.   The risk factors are reflected in the social history.  The roster of all physicians providing medical care to patient - is listed in the Snapshot section of the chart.  Activities of daily living:  The patient is 100% independent in all ADLs: dressing, toileting, feeding as well as independent mobility  Home safety : The patient has smoke detectors in the home. They wear seatbelts.  There are no firearms at home. There is no violence in the home.   There is no risks for hepatitis, STDs or HIV. There is no   history of blood transfusion. They have no travel history to infectious disease endemic areas of the world.  The patient has seen their dentist in the last six month. They have seen their eye doctor in the last year.They do not  have excessive sun exposure. Discussed the need for sun protection: hats, long sleeves and use of sunscreen if there is significant sun exposure.   Diet: the importance of a healthy diet is discussed. They do have a healthy diet.  The benefits of regular aerobic exercise were discussed. She walks 4 times per week ,  20 minutes.   Depression screen: there are no signs of untreated depression- irritability, change in appetite, anhedonia, sadness/tearfullness.  The following portions of the patient's history were reviewed and updated as appropriate: allergies, current medications, past family history, past medical history,  past surgical history, past social history  and problem list.  Visual acuity wast assessed  With a wall exam 20/25 in both eyes corrected.  Body  mass index were assessed and reviewed.   During the course of the visit the patient was educated and counseled about appropriate screening and preventive services including : fall prevention , diabetes screening,  nutrition counseling, colorectal cancer screening, and recommended immunizations.    CC: There were no encounter diagnoses.  History Sophia Benitez has a past medical history of Asthma and Frequent headaches.   She has no past surgical history on file.   Her family history includes Asthma in her mother.She reports that she has never smoked. She has never used smokeless tobacco. She reports that she does not use drugs. No history on file for alcohol use.  Outpatient Medications Prior to Visit  Medication Sig Dispense Refill  . albuterol (PROVENTIL) (2.5 MG/3ML) 0.083% nebulizer solution Take 2.5 mg by nebulization every 6 (six) hours as needed for wheezing or shortness of breath.    . ferrous sulfate 325 (65 FE) MG EC tablet Take 325 mg by mouth 3 (three) times daily with meals.    Marland Kitchen ibuprofen (ADVIL,MOTRIN) 600 MG tablet Take 1 tablet (600 mg total) by mouth every 6 (six) hours as needed. 30 tablet 0  . mirtazapine (REMERON) 15 MG tablet 1/2  before bedtime daily 45 tablet 2  . vitamin B-12 (CYANOCOBALAMIN) 1000 MCG tablet Take 1 tablet (1,000 mcg total) by mouth daily. 90 tablet 3   No facility-administered medications prior to visit.    Review of Systems  Objective:  BP 104/70 (BP Location: Left Arm, Patient Position: Sitting, Cuff Size: Normal)   Pulse (!) 59   Temp 98.8 F (37.1 C) (Oral)   Resp 14   Ht 5\' 6"  (1.676 m)   Wt 140 lb (63.5 kg)   SpO2 96%  BMI 22.60 kg/m   Physical Exam    Assessment & Plan:   Problem List Items Addressed This Visit    None      I am having Sophia Benitez maintain her ibuprofen, albuterol, ferrous sulfate, mirtazapine, and vitamin B-12.  No orders of the defined types were placed in this encounter.   There are no discontinued medications.  Follow-up: No follow-ups on file.   Sherlene Shams, MD

## 2020-03-28 NOTE — Assessment & Plan Note (Signed)

## 2020-04-26 DIAGNOSIS — G44309 Post-traumatic headache, unspecified, not intractable: Secondary | ICD-10-CM

## 2020-04-26 DIAGNOSIS — G44329 Chronic post-traumatic headache, not intractable: Secondary | ICD-10-CM

## 2020-05-02 ENCOUNTER — Encounter: Payer: Self-pay | Admitting: Neurology

## 2020-05-18 ENCOUNTER — Ambulatory Visit: Payer: No Typology Code available for payment source

## 2020-06-16 ENCOUNTER — Ambulatory Visit: Payer: No Typology Code available for payment source | Admitting: Neurology

## 2020-06-20 ENCOUNTER — Encounter: Payer: No Typology Code available for payment source | Admitting: Internal Medicine

## 2020-08-08 ENCOUNTER — Encounter: Payer: Self-pay | Admitting: Internal Medicine

## 2020-08-08 ENCOUNTER — Telehealth (INDEPENDENT_AMBULATORY_CARE_PROVIDER_SITE_OTHER): Payer: No Typology Code available for payment source | Admitting: Internal Medicine

## 2020-08-08 VITALS — Ht 66.0 in | Wt 140.0 lb

## 2020-08-08 DIAGNOSIS — J020 Streptococcal pharyngitis: Secondary | ICD-10-CM | POA: Diagnosis not present

## 2020-08-08 DIAGNOSIS — J029 Acute pharyngitis, unspecified: Secondary | ICD-10-CM | POA: Insufficient documentation

## 2020-08-08 LAB — POCT RAPID STREP A (OFFICE): Rapid Strep A Screen: POSITIVE — AB

## 2020-08-08 MED ORDER — AMOXICILLIN-POT CLAVULANATE 875-125 MG PO TABS: 1.0000 | ORAL_TABLET | Freq: Two times a day (BID) | ORAL | 0 refills | Status: DC

## 2020-08-08 NOTE — Assessment & Plan Note (Signed)
Empiric treatment for Strep throat with augmentin.  Will screen for COVD And influenza given recent contacts

## 2020-08-08 NOTE — Progress Notes (Signed)
Virtual Visit converted to Telephone note  This visit type was conducted due to national recommendations for restrictions regarding the COVID-19 pandemic (e.g. social distancing).  This format is felt to be most appropriate for this patient at this time.  All issues noted in this document were discussed and addressed.  No physical exam was performed (except for noted visual exam findings with Video Visits).   I attempted to connect with@ on 08/08/20 at  2:30 PM EST by a video enabled telemedicine application or telephone and verified that I am speaking with the correct person using two identifiers. Location patient: home Location provider: work or home office Persons participating in the virtual visit: patient, provider  I discussed the limitations, risks, security and privacy concerns of performing an evaluation and management service by telephone and the availability of in person appointments. I also discussed with the patient that there may be a patient responsible charge related to this service. The patient expressed understanding and agreed to proceed.  Interactive audio and video telecommunications were attempted between this provider and patient, however failed, due to patient having technical difficulties .  We continued and completed visit with audio only.   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  Her college  roommate has been sick with a persistent cough for several days,  Has not been COVID tested. Patient woke up Sunday with sore throat,  Had a subjective fever last night, Neck glands are swollen,  But No body aches shortness of breath or change in taste or smell , and No sinus congestion.. has been fully vaccinated against COVID. ,   ROS: See pertinent positives and negatives per HPI.  Past Medical History:  Diagnosis Date  . Asthma   . Frequent headaches     No past  surgical history on file.  Family History  Problem Relation Age of Onset  . Asthma Mother     SOCIAL HX:  reports that she has never smoked. She has never used smokeless tobacco. She reports that she does not use drugs. No history on file for alcohol use.   Current Outpatient Medications:  .  albuterol (PROVENTIL) (2.5 MG/3ML) 0.083% nebulizer solution, Take 2.5 mg by nebulization every 6 (six) hours as needed for wheezing or shortness of breath., Disp: , Rfl:  .  ibuprofen (ADVIL,MOTRIN) 600 MG tablet, Take 1 tablet (600 mg total) by mouth every 6 (six) hours as needed., Disp: 30 tablet, Rfl: 0 .  mirtazapine (REMERON) 15 MG tablet, 1/2  before bedtime daily, Disp: 45 tablet, Rfl: 2 .  vitamin B-12 (CYANOCOBALAMIN) 1000 MCG tablet, Take 1 tablet (1,000 mcg total) by mouth daily., Disp: 90 tablet, Rfl: 3 .  amoxicillin-clavulanate (AUGMENTIN) 875-125 MG tablet, Take 1 tablet by mouth 2 (two) times daily., Disp: 14 tablet, Rfl: 0  EXAM:   General impression: alert, cooperative and articulate.  No signs of being in distress  Lungs: speech is fluent sentence length suggests that patient is not short of breath and not punctuated by cough, sneezing or sniffing. Marland Kitchen   Psych: affect normal.  speech is articulate and non pressured .  Denies suicidal thoughts    ASSESSMENT AND PLAN:  Discussed the following assessment and plan:  Pharyngitis due to Streptococcus species  Pharyngitis, unspecified etiology - Plan: POCT rapid strep A, Novel Coronavirus, NAA ([facility]), POCT Influenza A/B  Pharyngitis  Empiric treatment for Strep throat with augmentin.  Will screen for COVD And influenza given recent contacts    I discussed the assessment and treatment plan with the patient. The patient was provided an opportunity to ask questions and all were answered. The patient agreed with the plan and demonstrated an understanding of the instructions.   The patient was advised to call back or seek an  in-person evaluation if the symptoms worsen or if the condition fails to improve as anticipated.  I provided 20 minutes of non-face-to-face time during this encounter.   Sherlene Shams, MD

## 2020-08-09 LAB — POCT INFLUENZA A/B
Influenza A, POC: NEGATIVE
Influenza B, POC: NEGATIVE

## 2020-08-09 NOTE — Progress Notes (Deleted)
NEUROLOGY CONSULTATION NOTE  Sophia Benitez MRN: [id number] DOB: Jul 27, 2001  Referring provider: Duncan Dull, MD Primary care provider: Duncan Dull, MD  Reason for consult:  Headaches and concussion   Subjective:  Sophia Benitez is a 19 year old ***-handed female who presents for headaches and concussion.  History supplemented by referring provider's notes.   12/16/2019 LABS:  CMP with Na 139, K 3.9, Cl 104, CO2 31, glucose 86, BUN 13, Cr 0.68, t bili 0.6, ALP 41, AST 17, ALT 12; TSH 1.37 03/27/2020 LABS:  CBC with WBC 5.4, HGB 10.7, HCT 32.6, PLT 198; Iron panel with iron 99, TIBC 319, ferritin 9; B12 428  PAST MEDICAL HISTORY: Past Medical History:  Diagnosis Date  . Asthma   . Frequent headaches     PAST SURGICAL HISTORY: No past surgical history on file.  MEDICATIONS: Current Outpatient Medications on File Prior to Visit  Medication Sig Dispense Refill  . albuterol (PROVENTIL) (2.5 MG/3ML) 0.083% nebulizer solution Take 2.5 mg by nebulization every 6 (six) hours as needed for wheezing or shortness of breath.    Marland Kitchen amoxicillin-clavulanate (AUGMENTIN) 875-125 MG tablet Take 1 tablet by mouth 2 (two) times daily. 14 tablet 0  . ibuprofen (ADVIL,MOTRIN) 600 MG tablet Take 1 tablet (600 mg total) by mouth every 6 (six) hours as needed. 30 tablet 0  . mirtazapine (REMERON) 15 MG tablet 1/2  before bedtime daily 45 tablet 2  . vitamin B-12 (CYANOCOBALAMIN) 1000 MCG tablet Take 1 tablet (1,000 mcg total) by mouth daily. 90 tablet 3   No current facility-administered medications on file prior to visit.    ALLERGIES: No Known Allergies  FAMILY HISTORY: Family History  Problem Relation Age of Onset  . Asthma Mother    ***.  SOCIAL HISTORY: Social History   Socioeconomic History  . Marital status: Single    Spouse name: Not on file  . Number of children: Not on file  . Years of education: Not on file  . Highest education level: Not on file  Occupational History   . Not on file  Tobacco Use  . Smoking status: Never Smoker  . Smokeless tobacco: Never Used  Substance and Sexual Activity  . Alcohol use: Not on file  . Drug use: Never  . Sexual activity: Not Currently  Other Topics Concern  . Not on file  Social History Narrative  . Not on file   Social Determinants of Health   Financial Resource Strain: Not on file  Food Insecurity: Not on file  Transportation Needs: Not on file  Physical Activity: Not on file  Stress: Not on file  Social Connections: Not on file  Intimate Partner Violence: Not on file    Objective:  *** General: No acute distress.  Patient appears well-groomed.   Head:  Normocephalic/atraumatic Eyes:  fundi examined but not visualized Neck: supple, no paraspinal tenderness, full range of motion Back: No paraspinal tenderness Heart: regular rate and rhythm Lungs: Clear to auscultation bilaterally. Vascular: No carotid bruits. Neurological Exam: Mental status: alert and oriented to person, place, and time, recent and remote memory intact, fund of knowledge intact, attention and concentration intact, speech fluent and not dysarthric, language intact. Cranial nerves: CN I: not tested CN II: pupils equal, round and reactive to light, visual fields intact CN III, IV, VI:  full range of motion, no nystagmus, no ptosis CN V: facial sensation intact. CN VII: upper and lower face symmetric CN VIII: hearing intact CN IX, X: gag intact,  uvula midline CN XI: sternocleidomastoid and trapezius muscles intact CN XII: tongue midline Bulk & Tone: normal, no fasciculations. Motor:  muscle strength 5/5 throughout Sensation:  Pinprick, temperature and vibratory sensation intact. Deep Tendon Reflexes:  2+ throughout,  toes downgoing.   Finger to nose testing:  Without dysmetria.   Heel to shin:  Without dysmetria.   Gait:  Normal station and stride.  Romberg negative.  Assessment/Plan:   ***    Thank you for allowing me  to take part in the care of this patient.  Shon Millet, DO  CC: Duncan Dull, MD

## 2020-08-10 ENCOUNTER — Encounter: Payer: Self-pay | Admitting: Neurology

## 2020-08-10 ENCOUNTER — Ambulatory Visit: Payer: No Typology Code available for payment source | Admitting: Neurology

## 2020-08-10 DIAGNOSIS — Z029 Encounter for administrative examinations, unspecified: Secondary | ICD-10-CM

## 2020-08-11 LAB — NOVEL CORONAVIRUS, NAA: SARS-CoV-2, NAA: NOT DETECTED

## 2020-08-11 LAB — SARS-COV-2, NAA 2 DAY TAT

## 2020-08-12 NOTE — Progress Notes (Signed)
NO COVID!

## 2020-08-16 ENCOUNTER — Other Ambulatory Visit: Payer: Self-pay

## 2020-08-16 ENCOUNTER — Other Ambulatory Visit: Payer: No Typology Code available for payment source

## 2020-08-16 ENCOUNTER — Telehealth: Payer: Self-pay | Admitting: Internal Medicine

## 2020-08-16 DIAGNOSIS — Z20822 Contact with and (suspected) exposure to covid-19: Secondary | ICD-10-CM

## 2020-08-16 NOTE — Telephone Encounter (Signed)
Rejection Reason - Patient was No Show" [facility] - Neurology said on Aug 10, 2020 9:40 AM

## 2020-08-17 LAB — SARS-COV-2, NAA 2 DAY TAT

## 2020-08-17 LAB — NOVEL CORONAVIRUS, NAA: SARS-CoV-2, NAA: NOT DETECTED

## 2021-01-19 ENCOUNTER — Ambulatory Visit (INDEPENDENT_AMBULATORY_CARE_PROVIDER_SITE_OTHER): Payer: No Typology Code available for payment source | Admitting: Internal Medicine

## 2021-01-19 ENCOUNTER — Other Ambulatory Visit: Payer: Self-pay

## 2021-01-19 ENCOUNTER — Encounter: Payer: Self-pay | Admitting: Internal Medicine

## 2021-01-19 VITALS — BP 102/60 | HR 60 | Temp 96.8°F | Resp 15 | Ht 66.0 in | Wt 144.2 lb

## 2021-01-19 DIAGNOSIS — D519 Vitamin B12 deficiency anemia, unspecified: Secondary | ICD-10-CM | POA: Diagnosis not present

## 2021-01-19 DIAGNOSIS — Z8709 Personal history of other diseases of the respiratory system: Secondary | ICD-10-CM | POA: Insufficient documentation

## 2021-01-19 DIAGNOSIS — Z1331 Encounter for screening for depression: Secondary | ICD-10-CM

## 2021-01-19 DIAGNOSIS — S060X9A Concussion with loss of consciousness of unspecified duration, initial encounter: Secondary | ICD-10-CM

## 2021-01-19 DIAGNOSIS — Z0000 Encounter for general adult medical examination without abnormal findings: Secondary | ICD-10-CM | POA: Diagnosis not present

## 2021-01-19 DIAGNOSIS — Z91410 Personal history of adult physical and sexual abuse: Secondary | ICD-10-CM

## 2021-01-19 LAB — CBC WITH DIFFERENTIAL/PLATELET: Neutro Abs: 3682 cells/uL (ref 1500–7800)
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
## 2021-01-19 NOTE — Assessment & Plan Note (Signed)
Diagnosed during childhood.  Has not used a bronchodilator or had any episodes of asthma exacerbation  in over 7 years.  Lung exam is normal

## 2021-01-19 NOTE — Assessment & Plan Note (Addendum)
Persistnnt, Normocytic,  With iron deficiency noted.  B12 deficiency has resolved and she has introduced fish as a protein source (previously vegetarian) for the past year, although she had been Vegetarian for 3 years prior.  Periods are regular in length and occurrence and moderately heavy  For the first 2 days.   Has not taken iron for therapy in several months due to GI intolerance . Recommending use of prenatal vitamins with iron.  If not tolerated,  Will refer to hematology for IV iron

## 2021-01-19 NOTE — Assessment & Plan Note (Signed)
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 her mood has been stable and she finished the year in good standing.  She denies any depressive symptoms currently .
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
## 2021-01-20 LAB — CBC WITH DIFFERENTIAL/PLATELET
Absolute Monocytes: 407 cells/uL (ref 200–950)
Basophils Absolute: 12 cells/uL (ref 0–200)
Basophils Relative: 0.2 %
Eosinophils Absolute: 77 cells/uL (ref 15–500)
Eosinophils Relative: 1.3 %
HCT: 35.1 % (ref 35.0–45.0)
Hemoglobin: 10.9 g/dL — ABNORMAL LOW (ref 11.7–15.5)
Lymphs Abs: 1723 cells/uL (ref 850–3900)
MCH: 25.1 pg — ABNORMAL LOW (ref 27.0–33.0)
MCHC: 31.1 g/dL — ABNORMAL LOW (ref 32.0–36.0)
MCV: 80.7 fL (ref 80.0–100.0)
MPV: 11.9 fL (ref 7.5–12.5)
Monocytes Relative: 6.9 %
Neutrophils Relative %: 62.4 %
Platelets: 228 10*3/uL (ref 140–400)
RBC: 4.35 10*6/uL (ref 3.80–5.10)
RDW: 14.6 % (ref 11.0–15.0)
Total Lymphocyte: 29.2 %
WBC: 5.9 10*3/uL (ref 3.8–10.8)

## 2021-01-20 LAB — COMPREHENSIVE METABOLIC PANEL
AG Ratio: 1.8 (calc) (ref 1.0–2.5)
ALT: 8 U/L (ref 6–29)
AST: 13 U/L (ref 10–30)
Albumin: 4.3 g/dL (ref 3.6–5.1)
Alkaline phosphatase (APISO): 38 U/L (ref 31–125)
BUN: 11 mg/dL (ref 7–25)
CO2: 26 mmol/L (ref 20–32)
Calcium: 9.4 mg/dL (ref 8.6–10.2)
Chloride: 105 mmol/L (ref 98–110)
Creat: 0.74 mg/dL (ref 0.50–1.10)
Globulin: 2.4 g/dL (calc) (ref 1.9–3.7)
Glucose, Bld: 81 mg/dL (ref 65–99)
Potassium: 4.1 mmol/L (ref 3.5–5.3)
Sodium: 140 mmol/L (ref 135–146)
Total Bilirubin: 0.4 mg/dL (ref 0.2–1.2)
Total Protein: 6.7 g/dL (ref 6.1–8.1)

## 2021-01-20 LAB — IRON,TIBC AND FERRITIN PANEL
%SAT: 8 % (calc) — ABNORMAL LOW (ref 16–45)
Ferritin: 4 ng/mL — ABNORMAL LOW (ref 16–154)
Iron: 30 ug/dL — ABNORMAL LOW (ref 40–190)
TIBC: 371 mcg/dL (calc) (ref 250–450)

## 2021-01-20 LAB — TSH: TSH: 2.09 mIU/L

## 2021-01-20 LAB — VITAMIN B12: Vitamin B-12: 511 pg/mL (ref 200–1100)

## 2021-01-21 ENCOUNTER — Encounter: Payer: Self-pay | Admitting: Internal Medicine

## 2021-01-21 ENCOUNTER — Other Ambulatory Visit: Payer: Self-pay | Admitting: Internal Medicine

## 2021-01-21 NOTE — Assessment & Plan Note (Signed)
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
## 2021-06-12 ENCOUNTER — Telehealth: Payer: No Typology Code available for payment source | Admitting: Physician Assistant

## 2021-06-12 DIAGNOSIS — J020 Streptococcal pharyngitis: Secondary | ICD-10-CM

## 2021-06-13 ENCOUNTER — Encounter: Payer: Self-pay | Admitting: Internal Medicine

## 2021-06-13 ENCOUNTER — Telehealth (INDEPENDENT_AMBULATORY_CARE_PROVIDER_SITE_OTHER): Payer: No Typology Code available for payment source | Admitting: Internal Medicine

## 2021-06-13 VITALS — Ht 65.98 in | Wt 138.0 lb

## 2021-06-13 DIAGNOSIS — J45909 Unspecified asthma, uncomplicated: Secondary | ICD-10-CM | POA: Diagnosis not present

## 2021-06-13 DIAGNOSIS — J069 Acute upper respiratory infection, unspecified: Secondary | ICD-10-CM | POA: Diagnosis not present

## 2021-06-13 DIAGNOSIS — J020 Streptococcal pharyngitis: Secondary | ICD-10-CM | POA: Diagnosis not present

## 2021-06-13 DIAGNOSIS — R0989 Other specified symptoms and signs involving the circulatory and respiratory systems: Secondary | ICD-10-CM | POA: Diagnosis not present

## 2021-06-13 DIAGNOSIS — J029 Acute pharyngitis, unspecified: Secondary | ICD-10-CM

## 2021-06-13 MED ORDER — AMOXICILLIN 500 MG PO TABS: 500.0000 mg | ORAL_TABLET | Freq: Two times a day (BID) | ORAL | 0 refills | Status: DC

## 2021-06-13 MED ORDER — ALBUTEROL SULFATE HFA 108 (90 BASE) MCG/ACT IN AERS: 1.0000 | INHALATION_SPRAY | Freq: Four times a day (QID) | RESPIRATORY_TRACT | 11 refills | Status: DC | PRN

## 2021-06-13 MED ORDER — AMOXICILLIN-POT CLAVULANATE 875-125 MG PO TABS: 1.0000 | ORAL_TABLET | Freq: Two times a day (BID) | ORAL | 0 refills | Status: DC

## 2021-06-13 NOTE — Patient Instructions (Signed)
  These are over the counter medication options:  Mucinex dm green label for cough.  Multivitamin  OR individual doses of :   Vitamin C 1000 mg daily.  Vitamin D3 4000 Iu (units) daily.  Zinc 100 mg daily.  Quercetin 250-500 mg 2 times per day   Elderberry  Oil of oregano  cepacol or chloroseptic spray sore throat Warm salt gargles  Warm tea with honey and lemon  Hydration  Try to eat though you dont feel like it   Tylenol or Advil  Nasal saline  Flonase           Are you feeling really sick? Shortness of breath, cough, chest pain?, dizziness? Confusion? Fever ?  If so let me know  If worsening, go to hospital or [facility] clinic Urgent care for further treatment.

## 2021-06-13 NOTE — Progress Notes (Signed)
I have spent 5 minutes in review of e-visit questionnaire, review and updating patient chart, medical decision making and response to patient.   Tyrese Capriotti Cody Tyray Proch, PA-C    
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
## 2021-06-13 NOTE — Addendum Note (Signed)
Addended by: Waldon Merl on: 06/13/2021 07:31 AM   Modules accepted: Orders

## 2021-06-13 NOTE — Progress Notes (Addendum)

## 2021-06-14 LAB — COVID-19, FLU A+B AND RSV
Influenza A, NAA: NOT DETECTED
Influenza B, NAA: NOT DETECTED
RSV, NAA: NOT DETECTED
SARS-CoV-2, NAA: NOT DETECTED

## 2021-06-15 ENCOUNTER — Other Ambulatory Visit: Payer: Self-pay

## 2021-08-06 ENCOUNTER — Telehealth: Payer: No Typology Code available for payment source | Admitting: Family

## 2021-08-06 ENCOUNTER — Other Ambulatory Visit: Payer: Self-pay

## 2021-08-07 ENCOUNTER — Encounter: Payer: Self-pay | Admitting: Internal Medicine

## 2021-08-07 ENCOUNTER — Other Ambulatory Visit: Payer: Self-pay

## 2021-08-07 ENCOUNTER — Telehealth (INDEPENDENT_AMBULATORY_CARE_PROVIDER_SITE_OTHER): Payer: No Typology Code available for payment source | Admitting: Internal Medicine

## 2021-08-07 VITALS — Ht 66.0 in | Wt 140.0 lb

## 2021-08-07 DIAGNOSIS — R6883 Chills (without fever): Secondary | ICD-10-CM | POA: Diagnosis not present

## 2021-08-07 DIAGNOSIS — R0981 Nasal congestion: Secondary | ICD-10-CM

## 2021-08-07 DIAGNOSIS — R52 Pain, unspecified: Secondary | ICD-10-CM

## 2021-08-07 DIAGNOSIS — R051 Acute cough: Secondary | ICD-10-CM

## 2021-08-07 DIAGNOSIS — G44309 Post-traumatic headache, unspecified, not intractable: Secondary | ICD-10-CM

## 2021-08-07 DIAGNOSIS — J019 Acute sinusitis, unspecified: Secondary | ICD-10-CM

## 2021-08-07 DIAGNOSIS — J069 Acute upper respiratory infection, unspecified: Secondary | ICD-10-CM

## 2021-08-07 DIAGNOSIS — R519 Headache, unspecified: Secondary | ICD-10-CM

## 2021-08-07 LAB — POCT INFLUENZA A/B
Influenza A, POC: NEGATIVE
Influenza B, POC: NEGATIVE

## 2021-08-07 LAB — POC COVID19 BINAXNOW: SARS Coronavirus 2 Ag: NEGATIVE

## 2021-08-07 MED ORDER — AZITHROMYCIN 250 MG PO TABS
ORAL_TABLET | ORAL | 0 refills | Status: AC
Start: 2021-08-07 — End: 2021-08-12

## 2021-08-07 MED ORDER — HYDROCOD POLST-CPM POLST ER 10-8 MG/5ML PO SUER: 5.0000 mL | Freq: Every evening | ORAL | 0 refills | Status: DC | PRN

## 2021-08-07 MED ORDER — DM-GUAIFENESIN ER 60-1200 MG PO TB12: 1.0000 | ORAL_TABLET | Freq: Two times a day (BID) | ORAL | 0 refills | Status: DC

## 2021-08-07 NOTE — Progress Notes (Signed)
Erroneous encounter, opened in error. Pt was a no show, unable to contact for video call.

## 2021-08-07 NOTE — Patient Instructions (Addendum)
If needing prescription strength medication we will need to make an appointment with a provider.  These are over the counter medication options:  Mucinex dm green label for cough.  Vitamin C (oranges or orange juice) Elderberry  Oil of oregano  cepacol or chloroseptic spray, warm salt gargles, sore throat  Warm tea with honey and lemon  Hydration  Try to eat though you dont feel like it   Tylenol or Advil  Nasal saline and Flonase -2 sprays     Monitor pulse oximeter, buy from [facility] if oxygen is less than 90 please go to the hospital.        Are you feeling really sick? Shortness of breath, cough, chest pain?, dizziness? Confusion   If so let me know  If worsening, go to hospital or [facility] clinic Urgent care for further treatment.

## 2021-08-07 NOTE — Progress Notes (Addendum)
Telephone Note  I connected with Sophia Benitez  on 08/07/21 at  3:30 PM EST by telephone and verified that I am speaking with the correct person using two identifiers.  Location patient: car Location provider:work or home office Persons participating in the virtual visit: patient, provider  I discussed the limitations of evaluation and management by telemedicine and the availability of in person appointments. The patient expressed understanding and agreed to proceed.   HPI:  Acute telemedicine visit for : -Onset: 08/02/21 pm  with night sweats, nasal congestion, cough, h/a x 3 days, cough on nyquil and cough drops and vicks vapor and nasal spray, claritin, runny nose, chills, subjective fever Sunday and monday [employer] sick at work coughing   H/o concussion x 2 and c/o postconcussion h/a since illness had h/a not responsive to otc meds  -COVID-19 vaccine status:2/2  ROS: See pertinent positives and negatives per HPI.  Past Medical History:  Diagnosis Date   Asthma    Concussion with < 1 hr loss of consciousness 12/18/2019   COVID-19    09/2020   Frequent headaches     No past surgical history on file.   Current Outpatient Medications:    albuterol (PROVENTIL) (2.5 MG/3ML) 0.083% nebulizer solution, Take 2.5 mg by nebulization every 6 (six) hours as needed for wheezing or shortness of breath., Disp: , Rfl:    albuterol (VENTOLIN HFA) 108 (90 Base) MCG/ACT inhaler, Inhale 1-2 puffs into the lungs every 6 (six) hours as needed for wheezing or shortness of breath., Disp: 18 g, Rfl: 11   azithromycin (ZITHROMAX) 250 MG tablet, With food Take 2 tablets on day 1, then 1 tablet daily on days 2 through 5, Disp: 6 tablet, Rfl: 0   chlorpheniramine-HYDROcodone (TUSSIONEX PENNKINETIC ER) 10-8 MG/5ML SUER, Take 5 mLs by mouth at bedtime as needed., Disp: 115 mL, Rfl: 0   Dextromethorphan-Guaifenesin 60-1200 MG 12hr tablet, Take 1 tablet by mouth every 12 (twelve) hours. Prn, Disp: 60  tablet, Rfl: 0   mirtazapine (REMERON) 15 MG tablet, 1/2  before bedtime daily (Patient not taking: Reported on 01/19/2021), Disp: 45 tablet, Rfl: 2   vitamin B-12 (CYANOCOBALAMIN) 1000 MCG tablet, Take 1 tablet (1,000 mcg total) by mouth daily. (Patient not taking: Reported on 01/19/2021), Disp: 90 tablet, Rfl: 3  EXAM:  VITALS per patient if applicable:  GENERAL: alert, oriented, appears well and in no acute distress  HEENT: atraumatic, conjunttiva clear, no obvious abnormalities on inspection of external nose and ears  NECK: normal movements of the head and neck  LUNGS: on inspection no signs of respiratory distress, breathing rate appears normal, no obvious gross SOB, gasping or wheezing  CV: no obvious cyanosis  MS: moves all visible extremities without noticeable abnormality  PSYCH/NEURO: pleasant and cooperative, no obvious depression or anxiety, speech and thought processing grossly intact  ASSESSMENT AND PLAN:  Discussed the following assessment and plan:  URI, nasal congestion, sinusitis with Body aches, h/a poc flu and covid negative - Plan: POCT Influenza A/B, chlorpheniramine-HYDROcodone (TUSSIONEX PENNKINETIC ER) 10-8 MG/5ML SUER, Dextromethorphan-Guaifenesin 60-1200 MG 12hr tablet, POC COVID-19, CANCELED: Novel Coronavirus, NAA ([facility]) Nasal saline, flonase, claritin Acute non-recurrent sinusitis, unspecified location - Plan: azithromycin (ZITHROMAX) 250 MG tablet  If needing prescription strength medication we will need to make an appointment with a provider.  These are over the counter medication options:  Mucinex dm green label for cough.  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 Pt is agreeable to treatment via telemedicine at this moment.  Work/School slipped offered: provided in patient instructions  yes  I discussed the assessment and treatment plan with the patient. The patient was provided an opportunity to ask questions and all were answered. The patient agreed with the plan and demonstrated an understanding of the instructions.    Time spent 20 min Bevelyn Buckles, MD

## 2021-08-08 ENCOUNTER — Other Ambulatory Visit: Payer: Self-pay

## 2021-08-08 ENCOUNTER — Encounter: Payer: Self-pay | Admitting: Internal Medicine

## 2021-08-08 DIAGNOSIS — G44309 Post-traumatic headache, unspecified, not intractable: Secondary | ICD-10-CM | POA: Insufficient documentation

## 2021-08-08 MED ORDER — RIZATRIPTAN BENZOATE 5 MG PO TBDP: 5.0000 mg | ORAL_TABLET | ORAL | 0 refills | Status: DC | PRN

## 2021-08-08 MED ORDER — RIZATRIPTAN BENZOATE 5 MG PO TBDP
5.0000 mg | ORAL_TABLET | ORAL | 0 refills | Status: AC | PRN
  Filled 2021-08-08: qty 10, 30d supply, fill #0

## 2021-08-08 NOTE — Addendum Note (Signed)
Addended by: Quentin Ore on: 08/08/2021 04:48 PM   Modules accepted: Orders

## 2021-08-09 ENCOUNTER — Other Ambulatory Visit: Payer: Self-pay

## 2021-08-10 ENCOUNTER — Other Ambulatory Visit: Payer: Self-pay

## 2021-09-06 ENCOUNTER — Encounter: Payer: Self-pay | Admitting: Family

## 2021-09-06 ENCOUNTER — Telehealth (INDEPENDENT_AMBULATORY_CARE_PROVIDER_SITE_OTHER): Payer: No Typology Code available for payment source | Admitting: Family

## 2021-09-06 ENCOUNTER — Other Ambulatory Visit: Payer: Self-pay

## 2021-09-06 VITALS — Ht 66.0 in | Wt 140.0 lb

## 2021-09-06 DIAGNOSIS — R509 Fever, unspecified: Secondary | ICD-10-CM | POA: Diagnosis not present

## 2021-09-06 DIAGNOSIS — J00 Acute nasopharyngitis [common cold]: Secondary | ICD-10-CM | POA: Diagnosis not present

## 2021-09-06 LAB — POCT INFLUENZA A/B
Influenza A, POC: NEGATIVE
Influenza B, POC: NEGATIVE

## 2021-09-06 LAB — POC COVID19 BINAXNOW: SARS Coronavirus 2 Ag: NEGATIVE

## 2021-09-06 NOTE — Assessment & Plan Note (Addendum)
Flu and COVID-negative, so we will treat as upper respiratory infection.  Encouraged oral fluids. D/w pt this is likely viral. If no improvement by day 5-7 we can consider an antibiotic. Discussed medications ok to take otc for management of symptoms. Also recommended otc mucinex.

## 2021-09-06 NOTE — Progress Notes (Signed)
MyChart Video Visit    Virtual Visit via Video Note   This visit type was conducted due to national recommendations for restrictions regarding the COVID-19 Pandemic (e.g. social distancing) in an effort to limit this patient's exposure and mitigate transmission in our community. This patient is at least at moderate risk for complications without adequate follow up. This format is felt to be most appropriate for this patient at this time. Physical exam was limited by quality of the video and audio technology used for the visit. CMA was able to get the patient set up on a video visit.  Patient location: Home. Patient and provider in visit Provider location: Office  I discussed the limitations of evaluation and management by telemedicine and the availability of in person appointments. The patient expressed understanding and agreed to proceed.  Visit Date: 09/06/2021  Today's healthcare provider: Mort Sawyers, FNP     Subjective:    Patient ID: Sophia Benitez, female    DOB: [birth year], 21 y.o.   MRN: [id number]  Chief Complaint  Patient presents with   Fever   Nasal Congestion   Cough   Emesis    Fever  Associated symptoms include congestion, coughing (with clear sputum at times) and nausea. Pertinent negatives include no chest pain, ear pain, sore throat, vomiting or wheezing.  Cough Associated symptoms include a fever, postnasal drip and rhinorrhea. Pertinent negatives include no chest pain, chills, ear pain, sore throat, shortness of breath or wheezing.  Emesis  Associated symptoms include coughing (with clear sputum at times) and a fever. Pertinent negatives include no chest pain or chills.   Pt here via video visit, started with symptoms two nights ago.   She c/o fever, chills, nasal congestion , rhinorrhea, as well as a dry non productive cough however she did have a coughing spell this am that made her throw up her recent drink. Only slight chest congestion. No sore  throat, no ear pain.  She has not directly checked her temperature, however she does feel sick.  She is taking otc sinex severe which is not helping her.   She does not have known exposure to covid or flu.    Past Medical History:  Diagnosis Date   Asthma    Concussion    x2   Concussion with < 1 hr loss of consciousness 12/18/2019   COVID-19    09/2020   Frequent headaches     History reviewed. No pertinent surgical history.  Family History  Problem Relation Age of Onset   Asthma Mother     Social History   Socioeconomic History   Marital status: Single    Spouse name: Not on file   Number of children: Not on file   Years of education: Not on file   Highest education level: Not on file  Occupational History   Not on file  Tobacco Use   Smoking status: Never   Smokeless tobacco: Never  Substance and Sexual Activity   Alcohol use: Not on file   Drug use: Never   Sexual activity: Not Currently  Other Topics Concern   Not on file  Social History Narrative   Not on file   Social Determinants of Health   Financial Resource Strain: Not on file  Food Insecurity: Not on file  Transportation Needs: Not on file  Physical Activity: Not on file  Stress: Not on file  Social Connections: Not on file  Intimate Partner Violence: Not on file  Outpatient Medications Prior to Visit  Medication Sig Dispense Refill   albuterol (PROVENTIL) (2.5 MG/3ML) 0.083% nebulizer solution Take 2.5 mg by nebulization every 6 (six) hours as needed for wheezing or shortness of breath.     albuterol (VENTOLIN HFA) 108 (90 Base) MCG/ACT inhaler Inhale 1-2 puffs into the lungs every 6 (six) hours as needed for wheezing or shortness of breath. 18 g 11   Dextromethorphan-Guaifenesin 60-1200 MG 12hr tablet Take 1 tablet by mouth every 12 (twelve) hours. Prn 60 tablet 0   rizatriptan (MAXALT-MLT) 5 MG disintegrating tablet Take 1 tablet (5 mg total) by mouth as needed for migraine. May repeat  in 2 hours if needed max dose 20 mg in 24 hours (4 pills) 10 tablet 0   chlorpheniramine-HYDROcodone (TUSSIONEX PENNKINETIC ER) 10-8 MG/5ML SUER Take 5 mLs by mouth at bedtime as needed. 115 mL 0   mirtazapine (REMERON) 15 MG tablet 1/2  before bedtime daily (Patient not taking: Reported on 09/06/2021) 45 tablet 2   vitamin B-12 (CYANOCOBALAMIN) 1000 MCG tablet Take 1 tablet (1,000 mcg total) by mouth daily. (Patient not taking: Reported on 09/06/2021) 90 tablet 3   No facility-administered medications prior to visit.    No Known Allergies  Review of Systems  Constitutional:  Positive for fever. Negative for chills.  HENT:  Positive for congestion, postnasal drip and rhinorrhea. Negative for ear pain, sinus pressure and sore throat.   Respiratory:  Positive for cough (with clear sputum at times). Negative for shortness of breath and wheezing.   Cardiovascular:  Negative for chest pain and palpitations.  Gastrointestinal:  Positive for nausea. Negative for vomiting.      Objective:    Physical Exam Constitutional:      Appearance: Normal appearance. She is normal weight.  HENT:     Head: Normocephalic.  Pulmonary:     Effort: Pulmonary effort is normal.  Neurological:     General: No focal deficit present.     Mental Status: She is alert.  Psychiatric:        Mood and Affect: Mood normal.        Behavior: Behavior normal.        Thought Content: Thought content normal.        Judgment: Judgment normal.    Ht 5\' 6"  (1.676 m)    Wt 140 lb (63.5 kg)    LMP 07/30/2021 (Approximate)    BMI 22.60 kg/m  Wt Readings from Last 3 Encounters:  09/06/21 140 lb (63.5 kg)  08/07/21 140 lb (63.5 kg)  06/13/21 138 lb (62.6 kg)       Assessment & Plan:   Problem List Items Addressed This Visit       Respiratory   Acute nasopharyngitis    Flu and COVID-negative, so we will treat as upper respiratory infection.  Encouraged oral fluids. D/w pt this is likely viral. If no improvement  by day 5-7 we can consider an antibiotic. Discussed medications ok to take otc for management of symptoms. Also recommended otc mucinex.         Other   Fever - Primary    covid and flu testing poct ordered. Analgesic prn fever.       Relevant Orders   POCT Influenza A/B (Completed)   POC COVID-19 BinaxNow (Completed)    I have discontinued Jakerria Steege's mirtazapine, vitamin B-12, and chlorpheniramine-HYDROcodone. I am also having her maintain her albuterol, albuterol, Dextromethorphan-Guaifenesin, and rizatriptan.  No orders of the defined types were  placed in this encounter.   I discussed the assessment and treatment plan with the patient. The patient was provided an opportunity to ask questions and all were answered. The patient agreed with the plan and demonstrated an understanding of the instructions.   The patient was advised to call back or seek an in-person evaluation if the symptoms worsen or if the condition fails to improve as anticipated.  I provided 15 minutes of face-to-face time during this encounter.   Mort Sawyers, FNP [facility] [phone] (phone) [phone] (fax)  [facility]

## 2021-09-06 NOTE — Assessment & Plan Note (Addendum)
covid and flu testing poct ordered. Analgesic prn fever.

## 2021-09-06 NOTE — Patient Instructions (Signed)
Be sure to rest, drink plenty of fluids, and use tylenol or ibuprofen as needed for pain. Follow up if fever >101, if symptoms worsen or if symptoms are not improved in 3 days.   You can try a few things over the counter to help with your symptoms including:  Cough: Delsym or Robitussin (get the off brand, works just as well) Chest Congestion: Mucinex (plain, not DM) Nasal Congestion/Ear Pressure/Sinus Pressure: Try using Flonase (fluticasone) nasal spray. This can be purchased over the counter. Body aches, fevers, headache: Ibuprofen (not to exceed 2400 mg in 24 hours) or Acetaminophen-Tylenol (not to exceed 3000 mg in 24 hours) Runny Nose/Throat Drainage/Sneezing/Itchy or Watery Eyes: An antihistamine such as Zyrtec, Claritin, Xyzal, Allegra  You should be feeling better by day seven of symptoms, but please do contact me if this is not the case.  Please do not hesitate to reach out with any questions and or concerns.  Regards,   Mort Sawyers

## 2021-11-08 ENCOUNTER — Other Ambulatory Visit: Payer: Self-pay

## 2021-11-08 ENCOUNTER — Ambulatory Visit
Admission: RE | Admit: 2021-11-08 | Discharge: 2021-11-08 | Disposition: A | Discharge status: Home / Self Care | Payer: No Typology Code available for payment source | Source: Ambulatory Visit | Attending: Internal Medicine | Admitting: Internal Medicine

## 2021-11-08 ENCOUNTER — Telehealth (INDEPENDENT_AMBULATORY_CARE_PROVIDER_SITE_OTHER): Payer: No Typology Code available for payment source | Admitting: Internal Medicine

## 2021-11-08 ENCOUNTER — Encounter: Payer: Self-pay | Admitting: Internal Medicine

## 2021-11-08 ENCOUNTER — Other Ambulatory Visit: Payer: No Typology Code available for payment source

## 2021-11-08 VITALS — Ht 66.0 in | Wt 135.0 lb

## 2021-11-08 DIAGNOSIS — S3991XA Unspecified injury of abdomen, initial encounter: Secondary | ICD-10-CM | POA: Diagnosis present

## 2021-11-08 DIAGNOSIS — R319 Hematuria, unspecified: Secondary | ICD-10-CM | POA: Diagnosis not present

## 2021-11-08 DIAGNOSIS — R1032 Left lower quadrant pain: Secondary | ICD-10-CM | POA: Insufficient documentation

## 2021-11-08 LAB — POCT URINE PREGNANCY: Preg Test, Ur: NEGATIVE

## 2021-11-08 MED ORDER — IOHEXOL 300 MG/ML  SOLN
100.0000 mL | Freq: Once | INTRAMUSCULAR | Status: AC | PRN
  Administered 2021-11-08: 100 mL via INTRAVENOUS

## 2021-11-08 NOTE — Addendum Note (Signed)
Addended by: Clearnce Sorrel on: 11/08/2021 04:12 PM ? ? Modules accepted: Orders ? ?

## 2021-11-08 NOTE — Addendum Note (Signed)
Addended by: Quentin Ore on: 11/08/2021 04:13 PM ? ? Modules accepted: Orders ? ?

## 2021-11-08 NOTE — Patient Instructions (Signed)

## 2021-11-08 NOTE — Progress Notes (Signed)
Virtual Visit via Video Note ? ?I connected with Sophia Benitez ? on 11/08/21 at  3:40 PM EDT by a video enabled telemedicine application and verified that I am speaking with the correct person using two identifiers. ? Location patient: [place] ?Location provider:work or home office ?Persons participating in the virtual visit: patient, provider ? ?I discussed the limitations and requested verbal permission for telemedicine visit. The patient expressed understanding and agreed to proceed. ? ? ?HPI: ? ?Acute telemedicine visit for : ? ?Patient got hit playing basketball  almost punched left lower quadrant of abdomen and this was on Monday 11/05/21 in the low left abdomen. Today having cramping , abdominal tightness now with blood in the urine starting yesterday 11/07/21 had 1 blood clot and today dripping blood when she urinates pain 4/10 in LLQ ? ? ?-Pertinent past medical history: see below ?-Pertinent medication allergies:No Known Allergies ?-COVID-19 vaccine status:  ?Immunization History  ?Administered Date(s) Administered  ? Influenza Split 04/15/2020  ? Influenza,inj,Quad PF,6+ Mos 07/24/2019  ? PFIZER(Purple Top)SARS-COV-2 Vaccination 12/24/2019, 01/20/2020  ? ? ? ?ROS: See pertinent positives and negatives per HPI. ? ?Past Medical History:  ?Diagnosis Date  ? Asthma   ? Concussion   ? x2  ? Concussion with < 1 hr loss of consciousness 12/18/2019  ? COVID-19   ? 09/2020  ? Frequent headaches   ? ? ?No past surgical history on file. ? ? ?Current Outpatient Medications:  ?  albuterol (PROVENTIL) (2.5 MG/3ML) 0.083% nebulizer solution, Take 2.5 mg by nebulization every 6 (six) hours as needed for wheezing or shortness of breath. (Patient not taking: Reported on 11/08/2021), Disp: , Rfl:  ?  albuterol (VENTOLIN HFA) 108 (90 Base) MCG/ACT inhaler, Inhale 1-2 puffs into the lungs every 6 (six) hours as needed for wheezing or shortness of breath. (Patient not taking: Reported on 11/08/2021), Disp: 18 g, Rfl: 11 ?   Dextromethorphan-Guaifenesin 60-1200 MG 12hr tablet, Take 1 tablet by mouth every 12 (twelve) hours. Prn (Patient not taking: Reported on 11/08/2021), Disp: 60 tablet, Rfl: 0 ?  rizatriptan (MAXALT-MLT) 5 MG disintegrating tablet, Take 1 tablet (5 mg total) by mouth as needed for migraine. May repeat in 2 hours if needed max dose 20 mg in 24 hours (4 pills) (Patient not taking: Reported on 11/08/2021), Disp: 10 tablet, Rfl: 0 ? ?EXAM: ? ?VITALS per patient if applicable: ? ?GENERAL: alert, oriented, appears well and in no acute distress ? ?HEENT: atraumatic, conjunttiva clear, no obvious abnormalities on inspection of external nose and ears ? ?NECK: normal movements of the head and neck ? ?LUNGS: on inspection no signs of respiratory distress, breathing rate appears normal, no obvious gross SOB, gasping or wheezing ? ?CV: no obvious cyanosis ? ?MS: moves all visible extremities without noticeable abnormality ? ?PSYCH/NEURO: pleasant and cooperative, no obvious depression or anxiety, speech and thought processing grossly intact ? ?ASSESSMENT AND PLAN: ? ?Discussed the following assessment and plan: ? ?Abdominal trauma, initial encounter - Plan: Comprehensive metabolic panel, CBC with Differential/Platelet, Urinalysis, Routine w reflex microscopic, Creatinine, CT Abdomen Pelvis W Contrast, POCT urine pregnancy ? ?Hematuria, unspecified type - Plan: Comprehensive metabolic panel, CBC with Differential/Platelet, Urinalysis, Routine w reflex microscopic, Creatinine, CT Abdomen Pelvis W Contrast, POCT urine pregnancy ? ?Left lower quadrant abdominal pain - Plan: CT Abdomen Pelvis W Contrast, POCT urine pregnancy ? ?-we discussed possible serious and likely etiologies, options for evaluation and workup, limitations of telemedicine visit vs in person visit, treatment, treatment risks and precautions. Pt is  agreeable to treatment via telemedicine at this moment.  ?I discussed the assessment and treatment plan with the patient.  The patient was provided an opportunity to ask questions and all were answered. The patient agreed with the plan and demonstrated an understanding of the instructions. ?  ? ?Time spent 20 min ?Pasty Spillers McLean-Scocuzza, MD   ?

## 2021-11-09 ENCOUNTER — Ambulatory Visit: Payer: No Typology Code available for payment source

## 2021-11-09 LAB — URINALYSIS, ROUTINE W REFLEX MICROSCOPIC
Bilirubin, UA: NEGATIVE
Glucose, UA: NEGATIVE
Ketones, UA: NEGATIVE
Leukocytes,UA: NEGATIVE
Nitrite, UA: NEGATIVE
Protein,UA: NEGATIVE
RBC, UA: NEGATIVE
Specific Gravity, UA: 1.025 (ref 1.005–1.030)
Urobilinogen, Ur: 0.2 mg/dL (ref 0.2–1.0)
pH, UA: 7 (ref 5.0–7.5)

## 2021-11-09 LAB — COMPREHENSIVE METABOLIC PANEL
ALT: 10 IU/L (ref 0–32)
AST: 18 IU/L (ref 0–40)
Albumin/Globulin Ratio: 2 (ref 1.2–2.2)
Albumin: 4.4 g/dL (ref 3.9–5.0)
Alkaline Phosphatase: 48 IU/L (ref 44–121)
BUN/Creatinine Ratio: 26 — ABNORMAL HIGH (ref 9–23)
BUN: 18 mg/dL (ref 6–20)
Bilirubin Total: 0.3 mg/dL (ref 0.0–1.2)
CO2: 25 mmol/L (ref 20–29)
Calcium: 9.4 mg/dL (ref 8.7–10.2)
Chloride: 106 mmol/L (ref 96–106)
Creatinine, Ser: 0.68 mg/dL (ref 0.57–1.00)
Globulin, Total: 2.2 g/dL (ref 1.5–4.5)
Glucose: 95 mg/dL (ref 70–99)
Potassium: 4.1 mmol/L (ref 3.5–5.2)
Sodium: 140 mmol/L (ref 134–144)
Total Protein: 6.6 g/dL (ref 6.0–8.5)
eGFR: 126 mL/min/{1.73_m2} (ref >59)

## 2021-11-09 LAB — CBC WITH DIFFERENTIAL/PLATELET
Basophils Absolute: 0 10*3/uL (ref 0.0–0.2)
Basos: 0 %
EOS (ABSOLUTE): 0.1 10*3/uL (ref 0.0–0.4)
Eos: 2 %
Hematocrit: 34.7 % (ref 34.0–46.6)
Hemoglobin: 11.1 g/dL (ref 11.1–15.9)
Immature Grans (Abs): 0 10*3/uL (ref 0.0–0.1)
Immature Granulocytes: 0 %
Lymphocytes Absolute: 2.1 10*3/uL (ref 0.7–3.1)
Lymphs: 36 %
MCH: 26.7 pg (ref 26.6–33.0)
MCHC: 32 g/dL (ref 31.5–35.7)
MCV: 84 fL (ref 79–97)
Monocytes Absolute: 0.5 10*3/uL (ref 0.1–0.9)
Monocytes: 8 %
Neutrophils Absolute: 3.1 10*3/uL (ref 1.4–7.0)
Neutrophils: 54 %
Platelets: 239 10*3/uL (ref 150–450)
RBC: 4.15 x10E6/uL (ref 3.77–5.28)
RDW: 13.1 % (ref 11.7–15.4)
WBC: 5.8 10*3/uL (ref 3.4–10.8)

## 2021-11-09 LAB — CREATININE, SERUM
Creatinine, Ser: 0.75 mg/dL (ref 0.57–1.00)
eGFR: 115 mL/min/{1.73_m2} (ref >59)

## 2022-08-03 IMAGING — CT CT ABD-PELV W/ CM
2 of 5 series · 16 of 46 positions shown, 18 images · IV contrast (APPLIED)
Comparison: None.

CLINICAL DATA: Abdominal trauma, blunt hematuria and blood clots in
urine due to blunt trauma [REDACTED] 11/05/21 llq with ab pain and
cramping r/o kidney contusion vs other

EXAM:
CT ABDOMEN AND PELVIS WITH CONTRAST
TECHNIQUE: Multidetector CT imaging of the abdomen and pelvis was performed
using the standard protocol following bolus administration of
intravenous contrast.

[Series 2: routine abd/pel with · axial · 0.63mm/px · z∈[-584,-229]mm · 13 of 83 slices shown, 15 images]
[im 6/83  soft-tissue]
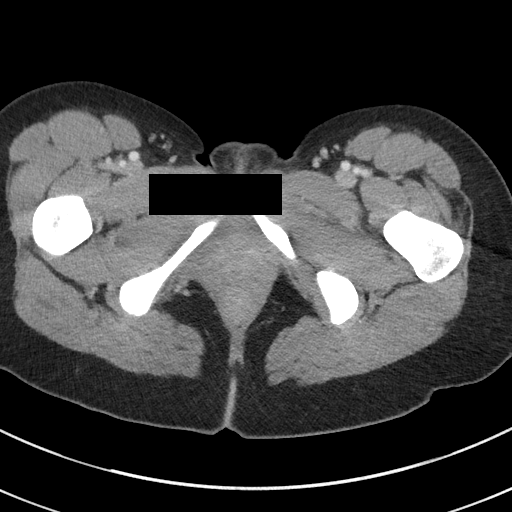
[im 6/83  bone]
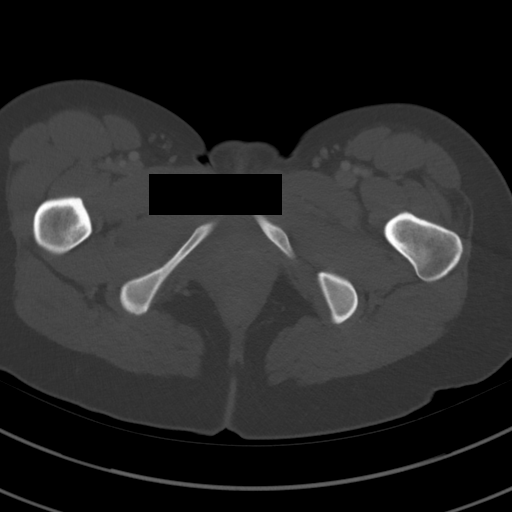
[im 12/83  soft-tissue]
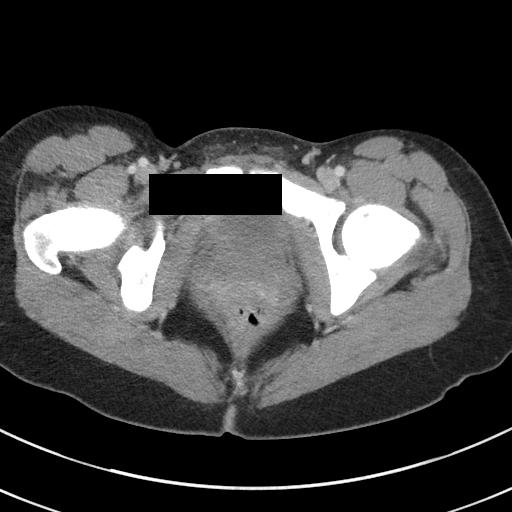
[im 18/83  soft-tissue]
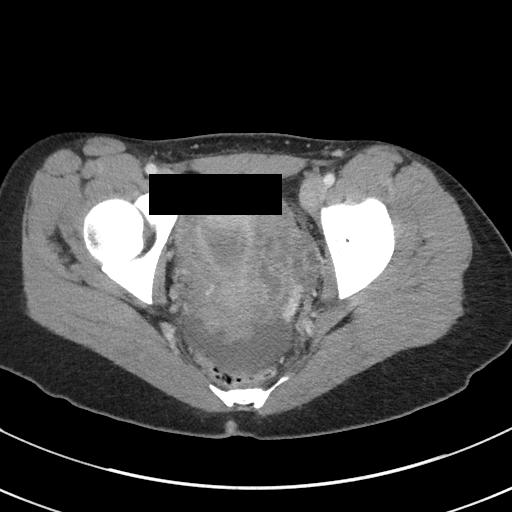
[im 24/83  soft-tissue]
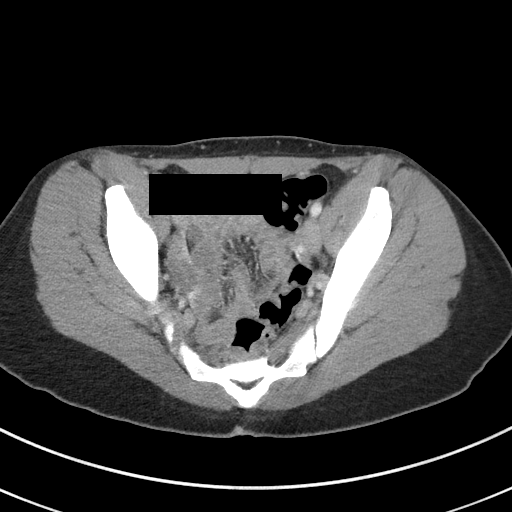
[im 30/83  soft-tissue]
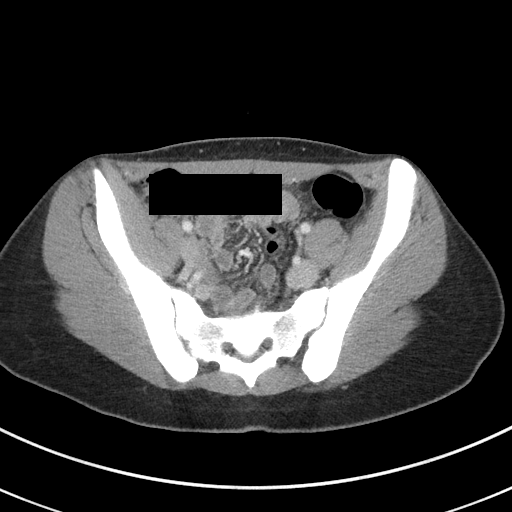
[im 36/83  soft-tissue]
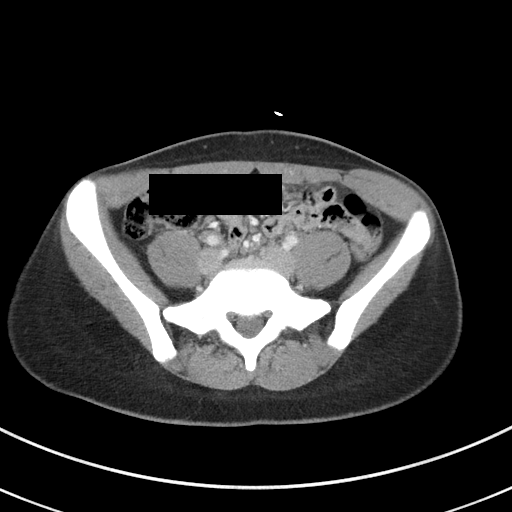
[im 42/83  soft-tissue]
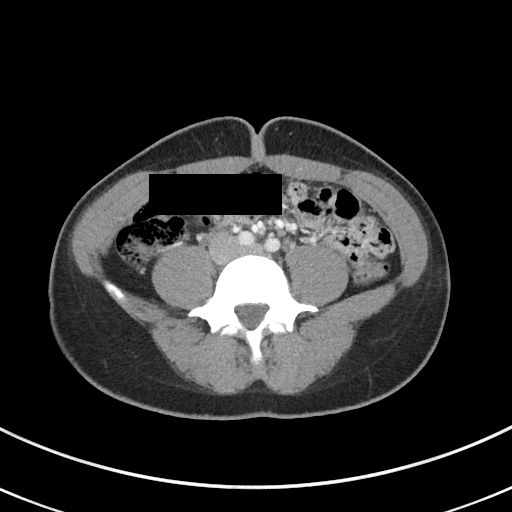
[im 47/83  soft-tissue]
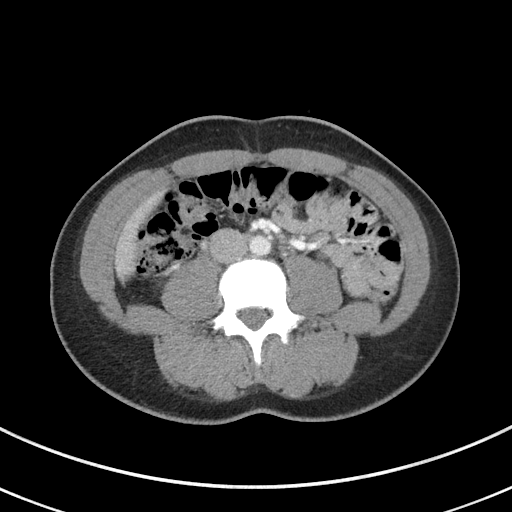
[im 53/83  soft-tissue]
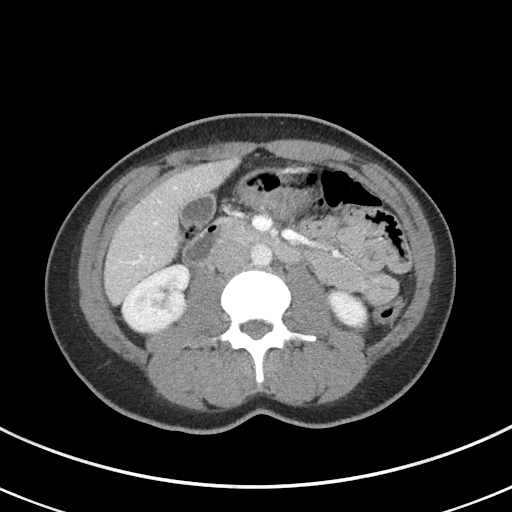
[im 53/83  bone]
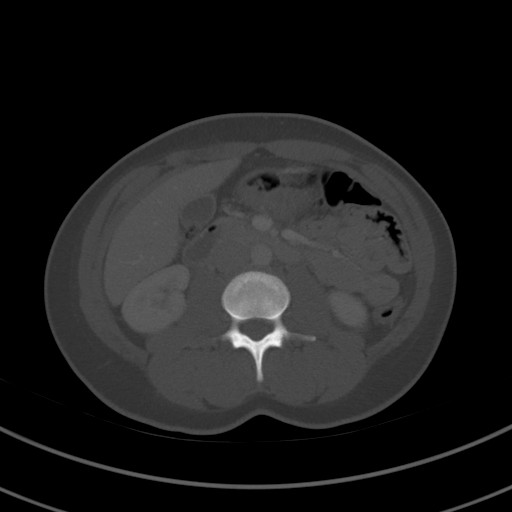
[im 59/83  soft-tissue]
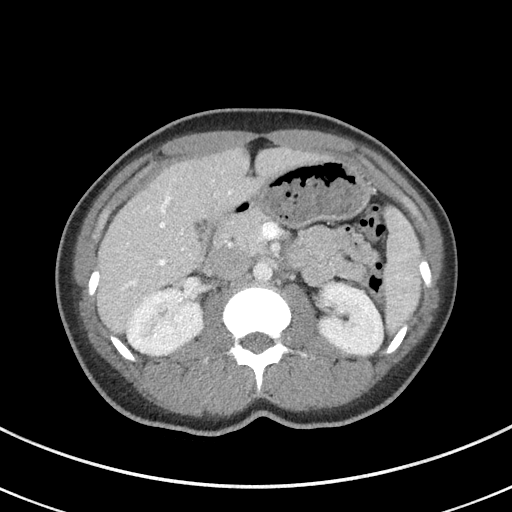
[im 65/83  soft-tissue]
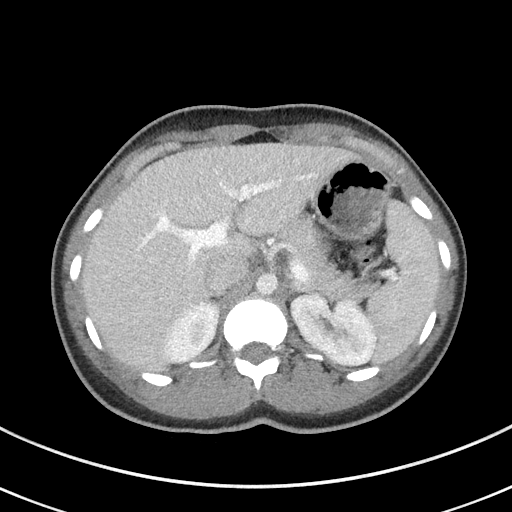
[im 71/83  soft-tissue]
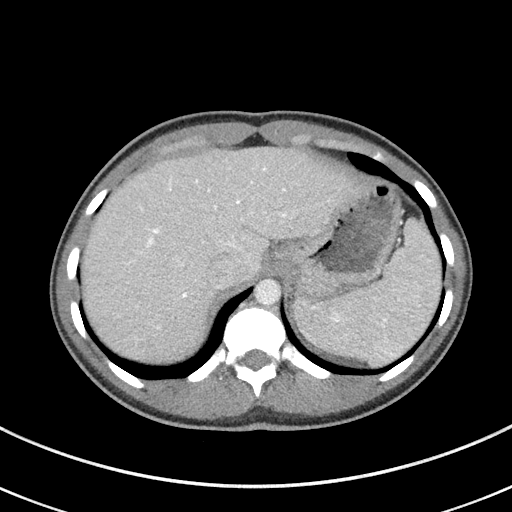
[im 77/83  soft-tissue]
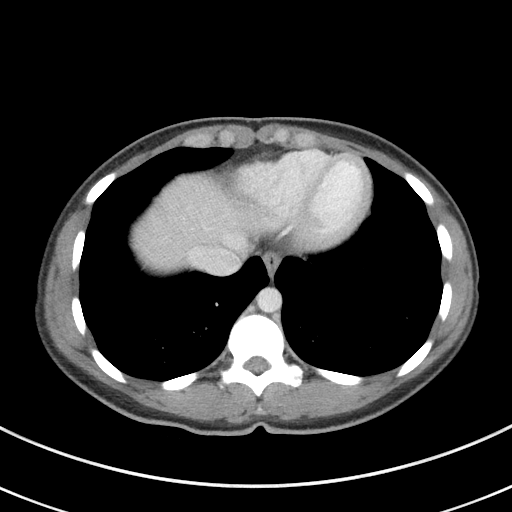

[Series 5: coronal st · coronal · 0.65mm/px · 3 of 78 slices shown]
[im 26/78  soft-tissue]
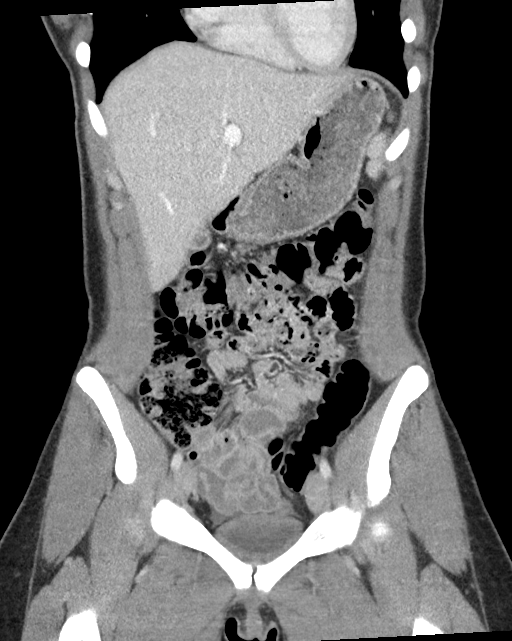
[im 35/78  soft-tissue]
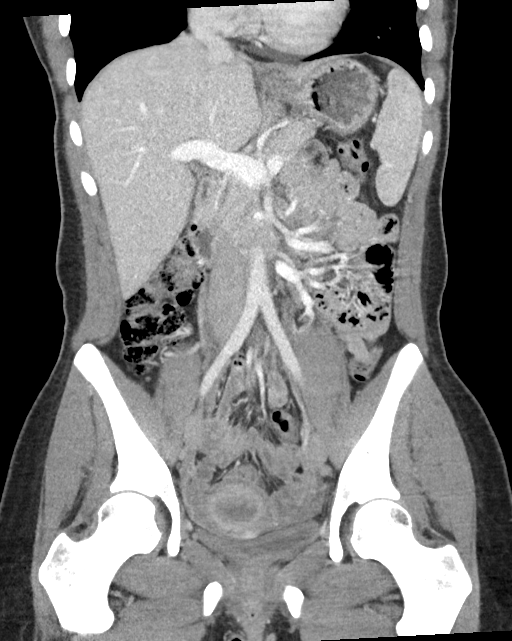
[im 43/78  soft-tissue]
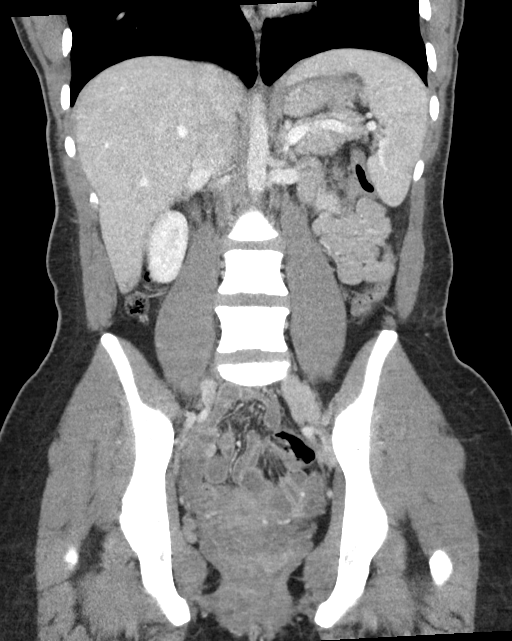

[16 of 46 positions shown; findings below may reference images not displayed]

RADIATION DOSE REDUCTION: This exam was performed according to the
departmental dose-optimization program which includes automated
exposure control, adjustment of the mA and/or kV according to
patient size and/or use of iterative reconstruction technique.

CONTRAST:  100mL OMNIPAQUE IOHEXOL 300 MG/ML  SOLN
FINDINGS: Lower chest: No acute abnormality.

Hepatobiliary: No focal liver abnormality is seen. The gallbladder
is unremarkable.

Pancreas: Unremarkable. No pancreatic ductal dilatation or
surrounding inflammatory changes.

Spleen: Normal in size without focal abnormality.

Adrenals/Urinary Tract: Adrenal glands are unremarkable. No
hydronephrosis or nephrolithiasis. Duplicated right renal collecting
system. There is no evidence of acute renal injury. The bladder is
unremarkable.

Stomach/Bowel: The stomach is within normal limits. There is no
evidence of bowel obstruction.The appendix is normal.

Vascular/Lymphatic: No significant vascular findings are present. No
enlarged abdominal or pelvic lymph nodes.

Reproductive: Corpus luteal cyst in the left ovary. Uterine
appearance is normal for age.

Other: No abdominal wall hernia or abnormality. Small volume pelvic
free fluid.

Musculoskeletal: No acute or significant osseous findings.
IMPRESSION: No acute abdominopelvic abnormality.  Normal appendix.

No evidence of acute renal injury.

Corpus luteal cyst in the left ovary with small volume pelvic free
fluid suggestive of recent ovulation.

## 2023-01-16 ENCOUNTER — Telehealth: Payer: Self-pay

## 2023-01-16 ENCOUNTER — Encounter: Payer: Self-pay | Admitting: Nurse Practitioner

## 2023-01-16 ENCOUNTER — Telehealth (INDEPENDENT_AMBULATORY_CARE_PROVIDER_SITE_OTHER): Payer: 59 | Admitting: Nurse Practitioner

## 2023-01-16 VITALS — Ht 66.0 in | Wt 143.0 lb

## 2023-01-16 DIAGNOSIS — G8929 Other chronic pain: Secondary | ICD-10-CM | POA: Diagnosis not present

## 2023-01-16 DIAGNOSIS — G44309 Post-traumatic headache, unspecified, not intractable: Secondary | ICD-10-CM

## 2023-01-16 DIAGNOSIS — R519 Headache, unspecified: Secondary | ICD-10-CM | POA: Diagnosis not present

## 2023-01-16 NOTE — Telephone Encounter (Signed)
Medication Samples have been provided to the patient.  Drug name: UBRELVY       Strength: 100 mg        Qty: 1 box (1 tablet)   LOT: [id number]  Exp.Date: 05/2024  Dosing instructions: 1 tablet as needed for headache  The patient has been instructed regarding the correct time, dose, and frequency of taking this medication, including desired effects and most common side effects.   Sophia Benitez 12:53 PM 01/16/2023    Sample has been given to her Mother Carys Neumeister

## 2023-01-16 NOTE — Progress Notes (Signed)
MyChart Video Visit    Virtual Visit via Video Note   This visit type was conducted because this format is felt to be most appropriate for this patient at this time. Physical exam was limited by quality of the video and audio technology used for the visit. CMA was able to get the patient set up on a video visit.  Patient location: Home. Patient and provider in visit Provider location: Office  I discussed the limitations of evaluation and management by telemedicine and the availability of in person appointments. The patient expressed understanding and agreed to proceed.  Visit Date: 01/16/2023  Today's healthcare provider: Bethanie Dicker, NP     Subjective:    Patient ID: Sophia Benitez, female    DOB: May 23, 2001, 22 y.o.   MRN: [id number]  Chief Complaint  Patient presents with   Migraine    HPI  Patient reports recent increase in headaches. She has a history of multiple concussions. She reports her headaches have been worsening for the past week. She has been having approximately one per day. They last for 10-15 minutes. She takes Excedrin migraine at the onset which helps dull it. She used to take Maxalt for her migraines, but did not get relief. She has never been evaluated by Neurology before. Denies light sensitivity. She has had some nausea with her headaches, no vomiting. Denies vision changes.   Past Medical History:  Diagnosis Date   Asthma    Concussion    x2   Concussion with < 1 hr loss of consciousness 12/18/2019   COVID-19    09/2020   Frequent headaches     History reviewed. No pertinent surgical history.  Family History  Problem Relation Age of Onset   Asthma Mother     Social History   Socioeconomic History   Marital status: Single    Spouse name: Not on file   Number of children: Not on file   Years of education: Not on file   Highest education level: Not on file  Occupational History   Not on file  Tobacco Use   Smoking status: Never    Smokeless tobacco: Never  Substance and Sexual Activity   Alcohol use: Not on file   Drug use: Never   Sexual activity: Not Currently  Other Topics Concern   Not on file  Social History Narrative   Not on file   Social Determinants of Health   Financial Resource Strain: Not on file  Food Insecurity: Not on file  Transportation Needs: Not on file  Physical Activity: Not on file  Stress: Not on file  Social Connections: Not on file  Intimate Partner Violence: Not on file    Outpatient Medications Prior to Visit  Medication Sig Dispense Refill   albuterol (PROVENTIL) (2.5 MG/3ML) 0.083% nebulizer solution Take 2.5 mg by nebulization every 6 (six) hours as needed for wheezing or shortness of breath. (Patient not taking: Reported on 11/08/2021)     albuterol (VENTOLIN HFA) 108 (90 Base) MCG/ACT inhaler Inhale 1-2 puffs into the lungs every 6 (six) hours as needed for wheezing or shortness of breath. (Patient not taking: Reported on 11/08/2021) 18 g 11   Dextromethorphan-Guaifenesin 60-1200 MG 12hr tablet Take 1 tablet by mouth every 12 (twelve) hours. Prn (Patient not taking: Reported on 11/08/2021) 60 tablet 0   rizatriptan (MAXALT-MLT) 5 MG disintegrating tablet Take 1 tablet (5 mg total) by mouth as needed for migraine. May repeat in 2 hours if needed max dose  20 mg in 24 hours (4 pills) (Patient not taking: Reported on 11/08/2021) 10 tablet 0   No facility-administered medications prior to visit.    No Known Allergies  ROS See HPI    Objective:    Physical Exam  Ht 5\' 6"  (1.676 m)   Wt 143 lb (64.9 kg)   BMI 23.08 kg/m  Wt Readings from Last 3 Encounters:  01/16/23 143 lb (64.9 kg)  11/08/21 135 lb (61.2 kg)  09/06/21 140 lb (63.5 kg)   GENERAL: alert, oriented, appears well and in no acute distress   HEENT: atraumatic, conjunttiva clear, no obvious abnormalities on inspection of external nose and ears   NECK: normal movements of the head and neck   LUNGS: on  inspection no signs of respiratory distress, breathing rate appears normal, no obvious gross SOB, gasping or wheezing   CV: no obvious cyanosis   MS: moves all visible extremities without noticeable abnormality   PSYCH/NEURO: pleasant and cooperative, no obvious depression or anxiety, speech and thought processing grossly intact     Assessment & Plan:   Problem List Items Addressed This Visit       Other   Post-concussion headache   Relevant Orders   Ambulatory referral to Neurology   Chronic intractable headache - Primary    Sample of Ubrelvy 100 mg provided to patient. She will take one at the onset of her headache to see if she can get relief. She can continue Excedrin PRN. Will refer to Neurology for further evaluation. Strict return precautions given to patient.       Relevant Orders   Ambulatory referral to Neurology    I am having Sophia Benitez maintain her albuterol, albuterol, Dextromethorphan-Guaifenesin, and rizatriptan.  No orders of the defined types were placed in this encounter.   I discussed the assessment and treatment plan with the patient. The patient was provided an opportunity to ask questions and all were answered. The patient agreed with the plan and demonstrated an understanding of the instructions.   The patient was advised to call back or seek an in-person evaluation if the symptoms worsen or if the condition fails to improve as anticipated.   Bethanie Dicker, NP [facility] [phone] (phone) [phone] (fax)  [facility]

## 2023-01-21 DIAGNOSIS — G8929 Other chronic pain: Secondary | ICD-10-CM | POA: Insufficient documentation

## 2023-01-21 NOTE — Assessment & Plan Note (Addendum)
Sample of Ubrelvy 100 mg provided to patient. She will take one at the onset of her headache to see if she can get relief. She can continue Excedrin PRN. Will refer to Neurology for further evaluation. Strict return precautions given to patient.

## 2023-02-06 ENCOUNTER — Encounter: Payer: Self-pay | Admitting: Nurse Practitioner

## 2023-02-06 ENCOUNTER — Ambulatory Visit: Payer: 59 | Admitting: Nurse Practitioner

## 2023-02-06 VITALS — BP 102/68 | HR 90 | Temp 99.9°F | Resp 16 | Ht 66.0 in | Wt 141.6 lb

## 2023-02-06 DIAGNOSIS — J029 Acute pharyngitis, unspecified: Secondary | ICD-10-CM | POA: Diagnosis not present

## 2023-02-06 DIAGNOSIS — U071 COVID-19: Secondary | ICD-10-CM | POA: Diagnosis not present

## 2023-02-06 LAB — POC COVID19 BINAXNOW: SARS Coronavirus 2 Ag: POSITIVE — AB

## 2023-02-06 LAB — POCT RAPID STREP A (OFFICE): Rapid Strep A Screen: NEGATIVE

## 2023-02-06 MED ORDER — BENZONATATE 200 MG PO CAPS: 200.0000 mg | ORAL_CAPSULE | Freq: Three times a day (TID) | ORAL | 0 refills | Status: DC | PRN

## 2023-02-06 MED ORDER — MOLNUPIRAVIR EUA 200MG CAPSULE
4.0000 | ORAL_CAPSULE | Freq: Two times a day (BID) | ORAL | 0 refills | Status: AC
Start: 2023-02-06 — End: 2023-02-11

## 2023-02-06 NOTE — Progress Notes (Signed)
Established Patient Office Visit  Subjective:  Patient ID: Sophia Benitez, female    DOB: 07/21/2001  Age: 22 y.o. MRN: [id number]  CC:  Chief Complaint  Patient presents with   Sore Throat    HPI  Danilah Sielaff presents for:  Sore Throat  This is a new problem. The current episode started in the past 7 days. There has been no fever. Associated symptoms include coughing and headaches. Pertinent negatives include no congestion, ear pain, plugged ear sensation or neck pain. Associated symptoms comments: nausea. She has had no exposure to strep or mono. She has tried nothing for the symptoms.  LMP : 1 week ago  Past Medical History:  Diagnosis Date   Asthma    Concussion    x2   Concussion with < 1 hr loss of consciousness 12/18/2019   COVID-19    09/2020   Frequent headaches     History reviewed. No pertinent surgical history.  Family History  Problem Relation Age of Onset   Asthma Mother     Social History   Socioeconomic History   Marital status: Single    Spouse name: Not on file   Number of children: Not on file   Years of education: Not on file   Highest education level: Not on file  Occupational History   Not on file  Tobacco Use   Smoking status: Never   Smokeless tobacco: Never  Substance and Sexual Activity   Alcohol use: Not on file   Drug use: Never   Sexual activity: Not Currently  Other Topics Concern   Not on file  Social History Narrative   Not on file   Social Determinants of Health   Financial Resource Strain: Not on file  Food Insecurity: Not on file  Transportation Needs: Not on file  Physical Activity: Not on file  Stress: Not on file  Social Connections: Not on file  Intimate Partner Violence: Not on file     Outpatient Medications Prior to Visit  Medication Sig Dispense Refill   albuterol (PROVENTIL) (2.5 MG/3ML) 0.083% nebulizer solution Take 2.5 mg by nebulization every 6 (six) hours as needed for wheezing or shortness  of breath.     albuterol (VENTOLIN HFA) 108 (90 Base) MCG/ACT inhaler Inhale 1-2 puffs into the lungs every 6 (six) hours as needed for wheezing or shortness of breath. 18 g 11   rizatriptan (MAXALT-MLT) 5 MG disintegrating tablet Take 1 tablet (5 mg total) by mouth as needed for migraine. May repeat in 2 hours if needed max dose 20 mg in 24 hours (4 pills) 10 tablet 0   Dextromethorphan-Guaifenesin 60-1200 MG 12hr tablet Take 1 tablet by mouth every 12 (twelve) hours. Prn (Patient not taking: Reported on 11/08/2021) 60 tablet 0   No facility-administered medications prior to visit.    No Known Allergies  ROS Review of Systems  HENT:  Negative for congestion and ear pain.   Respiratory:  Positive for cough.   Musculoskeletal:  Negative for neck pain.  Neurological:  Positive for headaches.   Negative unless indicated in HPI.    Objective:    Physical Exam HENT:     Right Ear: Tympanic membrane normal. Tympanic membrane is not erythematous.     Left Ear: Tympanic membrane normal. Tympanic membrane is not erythematous.     Nose:     Right Turbinates: Not enlarged.     Left Turbinates: Not enlarged.     Right Sinus: No maxillary sinus tenderness  or frontal sinus tenderness.     Left Sinus: No maxillary sinus tenderness or frontal sinus tenderness.     Mouth/Throat:     Mouth: Mucous membranes are moist.     Pharynx: No pharyngeal swelling, oropharyngeal exudate or posterior oropharyngeal erythema.     Tonsils: No tonsillar exudate.  Cardiovascular:     Rate and Rhythm: Normal rate and regular rhythm.  Pulmonary:     Effort: Pulmonary effort is normal.     Breath sounds: Normal breath sounds. No stridor. No wheezing.  Neurological:     General: No focal deficit present.     Mental Status: She is oriented to person, place, and time. Mental status is at baseline.  Psychiatric:        Mood and Affect: Mood normal.        Behavior: Behavior normal.        Thought Content: Thought  content normal.        Judgment: Judgment normal.     BP 102/68   Pulse 90   Temp 99.9 F (37.7 C)   Resp 16   Ht 5\' 6"  (1.676 m)   Wt 141 lb 9.6 oz (64.2 kg)   SpO2 98%   BMI 22.85 kg/m  Wt Readings from Last 3 Encounters:  02/06/23 141 lb 9.6 oz (64.2 kg)  01/16/23 143 lb (64.9 kg)  11/08/21 135 lb (61.2 kg)     Health Maintenance  Topic Date Due   HPV VACCINES (1 - 2-dose series) Never done   HIV Screening  Never done   Hepatitis C Screening  Never done   DTaP/Tdap/Td (1 - Tdap) Never done   PAP-Cervical Cytology Screening  Never done   PAP SMEAR-Modifier  Never done   COVID-19 Vaccine (3 - 2023-24 season) 04/19/2022   INFLUENZA VACCINE  03/20/2023       Topic Date Due   HPV VACCINES (1 - 2-dose series) Never done    Lab Results  Component Value Date   TSH 2.09 01/19/2021   Lab Results  Component Value Date   WBC 5.8 11/08/2021   HGB 11.1 11/08/2021   HCT 34.7 11/08/2021   MCV 84 11/08/2021   PLT 239 11/08/2021   Lab Results  Component Value Date   NA 140 11/08/2021   K 4.1 11/08/2021   CO2 25 11/08/2021   GLUCOSE 95 11/08/2021   BUN 18 11/08/2021   CREATININE 0.75 11/08/2021   CREATININE 0.68 11/08/2021   BILITOT 0.3 11/08/2021   ALKPHOS 48 11/08/2021   AST 18 11/08/2021   ALT 10 11/08/2021   PROT 6.6 11/08/2021   ALBUMIN 4.4 11/08/2021   CALCIUM 9.4 11/08/2021   EGFR 115 11/08/2021   EGFR 126 11/08/2021   GFR 132.53 12/16/2019   No results found for: "CHOL" No results found for: "HDL" No results found for: "LDLCALC" No results found for: "TRIG" No results found for: "CHOLHDL" No results found for: "HGBA1C"    Assessment & Plan:  COVID Assessment & Plan: POCT COVID-positive and strep negative. Will treat with Paxlovid and benzonatate Perles. Advised patient to take ibuprofen/Tylenol for body aches and fever. Increase fluid intake, 5 days of isolation and masking till day 10.  Orders: -     molnupiravir EUA; Take 4 capsules  (800 mg total) by mouth 2 (two) times daily for 5 days.  Dispense: 40 capsule; Refill: 0  Sorethroat -     POCT rapid strep A -     POC COVID-19  BinaxNow  Other orders -     Benzonatate; Take 1 capsule (200 mg total) by mouth 3 (three) times daily as needed for cough.  Dispense: 20 capsule; Refill: 0    Follow-up: No follow-ups on file.   Kara Dies, NP

## 2023-02-06 NOTE — Patient Instructions (Addendum)
You are COVID positive. Molnupiravir and benzonatate sent to pharmacy. Take ibuprofen/tylenol for fever and pain.  5 days of quarantine and if symptoms present after 5 days masking for 5 more days.   .    Person Under Monitoring Name: Sophia Benitez  Location: [address] [place] [address]   Infection Prevention Recommendations for Individuals Confirmed to have, or Being Evaluated for, 2019 Novel Coronavirus (COVID-19) Infection Who Receive Care at Home  Individuals who are confirmed to have, or are being evaluated for, COVID-19 should follow the prevention steps below until a healthcare provider or local or state health department says they can return to normal activities.  Stay home except to get medical care You should restrict activities outside your home, except for getting medical care. Do not go to work, school, or public areas, and do not use public transportation or taxis.  Call ahead before visiting your doctor Before your medical appointment, call the healthcare provider and tell them that you have, or are being evaluated for, COVID-19 infection. This will help the healthcare provider's office take steps to keep other people from getting infected. Ask your healthcare provider to call the local or state health department.  Monitor your symptoms Seek prompt medical attention if your illness is worsening (e.g., difficulty breathing). Before going to your medical appointment, call the healthcare provider and tell them that you have, or are being evaluated for, COVID-19 infection. Ask your healthcare provider to call the local or state health department.  Wear a facemask You should wear a facemask that covers your nose and mouth when you are in the same room with other people and when you visit a healthcare provider. People who live with or visit you should also wear a facemask while they are in the same room with you.  Separate yourself from other people  in your home As much as possible, you should stay in a different room from other people in your home. Also, you should use a separate bathroom, if available.  Avoid sharing household items You should not share dishes, drinking glasses, cups, eating utensils, towels, bedding, or other items with other people in your home. After using these items, you should wash them thoroughly with soap and water.  Cover your coughs and sneezes Cover your mouth and nose with a tissue when you cough or sneeze, or you can cough or sneeze into your sleeve. Throw used tissues in a lined trash can, and immediately wash your hands with soap and water for at least 20 seconds or use an alcohol-based hand rub.  Wash your [employer] your hands often and thoroughly with soap and water for at least 20 seconds. You can use an alcohol-based hand sanitizer if soap and water are not available and if your hands are not visibly dirty. Avoid touching your eyes, nose, and mouth with unwashed hands.   Prevention Steps for Caregivers and Household Members of Individuals Confirmed to have, or Being Evaluated for, COVID-19 Infection Being Cared for in the Home  If you live with, or provide care at home for, a person confirmed to have, or being evaluated for, COVID-19 infection please follow these guidelines to prevent infection:  Follow healthcare provider's instructions Make sure that you understand and can help the patient follow any healthcare provider instructions for all care.  Provide for the patient's basic needs You should help the patient with basic needs in the home and provide support for getting groceries, prescriptions, and other personal needs.  Monitor the patient's symptoms If they are getting sicker, call his or her medical provider and tell them that the patient has, or is being evaluated for, COVID-19 infection. This will help the healthcare provider's office take steps to keep other people from getting  infected. Ask the healthcare provider to call the local or state health department.  Limit the number of people who have contact with the patient If possible, have only one caregiver for the patient. Other household members should stay in another home or place of residence. If this is not possible, they should stay in another room, or be separated from the patient as much as possible. Use a separate bathroom, if available. Restrict visitors who do not have an essential need to be in the home.  Keep older adults, very young children, and other sick people away from the patient Keep older adults, very young children, and those who have compromised immune systems or chronic health conditions away from the patient. This includes people with chronic heart, lung, or kidney conditions, diabetes, and cancer.  Ensure good ventilation Make sure that shared spaces in the home have good air flow, such as from an air conditioner or an opened window, weather permitting.  Wash your hands often Wash your hands often and thoroughly with soap and water for at least 20 seconds. You can use an alcohol based hand sanitizer if soap and water are not available and if your hands are not visibly dirty. Avoid touching your eyes, nose, and mouth with unwashed hands. Use disposable paper towels to dry your hands. If not available, use dedicated cloth towels and replace them when they become wet.  Wear a facemask and gloves Wear a disposable facemask at all times in the room and gloves when you touch or have contact with the patient's blood, body fluids, and/or secretions or excretions, such as sweat, saliva, sputum, nasal mucus, vomit, urine, or feces.  Ensure the mask fits over your nose and mouth tightly, and do not touch it during use. Throw out disposable facemasks and gloves after using them. Do not reuse. Wash your hands immediately after removing your facemask and gloves. If your personal clothing becomes  contaminated, carefully remove clothing and launder. Wash your hands after handling contaminated clothing. Place all used disposable facemasks, gloves, and other waste in a lined container before disposing them with other household waste. Remove gloves and wash your hands immediately after handling these items.  Do not share dishes, glasses, or other household items with the patient Avoid sharing household items. You should not share dishes, drinking glasses, cups, eating utensils, towels, bedding, or other items with a patient who is confirmed to have, or being evaluated for, COVID-19 infection. After the person uses these items, you should wash them thoroughly with soap and water.  Wash laundry thoroughly Immediately remove and wash clothes or bedding that have blood, body fluids, and/or secretions or excretions, such as sweat, saliva, sputum, nasal mucus, vomit, urine, or feces, on them. Wear gloves when handling laundry from the patient. Read and follow directions on labels of laundry or clothing items and detergent. In general, wash and dry with the warmest temperatures recommended on the label.  Clean all areas the individual has used often Clean all touchable surfaces, such as counters, tabletops, doorknobs, bathroom fixtures, toilets, phones, keyboards, tablets, and bedside tables, every day. Also, clean any surfaces that may have blood, body fluids, and/or secretions or excretions on them. Wear gloves when cleaning surfaces the patient has  come in contact with. Use a diluted bleach solution (e.g., dilute bleach with 1 part bleach and 10 parts water) or a household disinfectant with a label that says EPA-registered for coronaviruses. To make a bleach solution at home, add 1 tablespoon of bleach to 1 quart (4 cups) of water. For a larger supply, add  cup of bleach to 1 gallon (16 cups) of water. Read labels of cleaning products and follow recommendations provided on product labels. Labels  contain instructions for safe and effective use of the cleaning product including precautions you should take when applying the product, such as wearing gloves or eye protection and making sure you have good ventilation during use of the product. Remove gloves and wash hands immediately after cleaning.  Monitor yourself for signs and symptoms of illness Caregivers and household members are considered close contacts, should monitor their health, and will be asked to limit movement outside of the home to the extent possible. Follow the monitoring steps for close contacts listed on the symptom monitoring form.   ? If you have additional questions, contact your local health department or call the epidemiologist on call at [phone] (available 24/7). ? This guidance is subject to change. For the most up-to-date guidance from [facility], please refer to their website: [employer]

## 2023-02-21 DIAGNOSIS — U071 COVID-19: Secondary | ICD-10-CM | POA: Insufficient documentation

## 2023-02-21 NOTE — Assessment & Plan Note (Signed)
POCT COVID-positive and strep negative. Will treat with Paxlovid and benzonatate Perles. Advised patient to take ibuprofen/Tylenol for body aches and fever. Increase fluid intake, 5 days of isolation and masking till day 10.

## 2023-05-13 ENCOUNTER — Emergency Department
Admission: EM | Admit: 2023-05-13 | Discharge: 2023-05-13 | Disposition: A | Discharge status: Home / Self Care | Payer: 59 | Attending: Emergency Medicine | Admitting: Emergency Medicine

## 2023-05-13 ENCOUNTER — Emergency Department: Payer: 59

## 2023-05-13 ENCOUNTER — Other Ambulatory Visit: Payer: Self-pay

## 2023-05-13 DIAGNOSIS — R102 Pelvic and perineal pain: Secondary | ICD-10-CM | POA: Insufficient documentation

## 2023-05-13 DIAGNOSIS — R1030 Lower abdominal pain, unspecified: Secondary | ICD-10-CM | POA: Diagnosis not present

## 2023-05-13 DIAGNOSIS — N939 Abnormal uterine and vaginal bleeding, unspecified: Secondary | ICD-10-CM | POA: Insufficient documentation

## 2023-05-13 LAB — CBC
HCT: 35 % — ABNORMAL LOW (ref 36.0–46.0)
Hemoglobin: 10.8 g/dL — ABNORMAL LOW (ref 12.0–15.0)
MCH: 24.7 pg — ABNORMAL LOW (ref 26.0–34.0)
MCHC: 30.9 g/dL (ref 30.0–36.0)
MCV: 79.9 fL — ABNORMAL LOW (ref 80.0–100.0)
Platelets: 259 10*3/uL (ref 150–400)
RBC: 4.38 MIL/uL (ref 3.87–5.11)
RDW: 14.9 % (ref 11.5–15.5)
WBC: 7.6 10*3/uL (ref 4.0–10.5)
nRBC: 0 % (ref 0.0–0.2)

## 2023-05-13 LAB — URINALYSIS, ROUTINE W REFLEX MICROSCOPIC
Bacteria, UA: NONE SEEN
Bilirubin Urine: NEGATIVE
Glucose, UA: NEGATIVE mg/dL
Ketones, ur: 5 mg/dL — AB
Leukocytes,Ua: NEGATIVE
Nitrite: NEGATIVE
Protein, ur: NEGATIVE mg/dL
Specific Gravity, Urine: 1.029 (ref 1.005–1.030)
pH: 5 (ref 5.0–8.0)

## 2023-05-13 LAB — POC URINE PREG, ED: Preg Test, Ur: NEGATIVE

## 2023-05-13 LAB — BASIC METABOLIC PANEL
Anion gap: 10 (ref 5–15)
BUN: 17 mg/dL (ref 6–20)
CO2: 25 mmol/L (ref 22–32)
Calcium: 8.9 mg/dL (ref 8.9–10.3)
Chloride: 104 mmol/L (ref 98–111)
Creatinine, Ser: 0.73 mg/dL (ref 0.44–1.00)
GFR, Estimated: >60 mL/min (ref >60)
Glucose, Bld: 89 mg/dL (ref 70–99)
Potassium: 3.6 mmol/L (ref 3.5–5.1)
Sodium: 139 mmol/L (ref 135–145)

## 2023-05-13 MED ORDER — KETOROLAC TROMETHAMINE 15 MG/ML IJ SOLN
15.0000 mg | Freq: Once | INTRAMUSCULAR | Status: DC
  Filled 2023-05-13: qty 1

## 2023-05-13 NOTE — ED Triage Notes (Signed)
Pt to ED For vaginal bleeding x2 weeks. Reports goes through 5-6 tampons in a day.  C/o right leg pain x5 days, no injury swelling or redness.   Pt in NAD , talking on phone in [email or web address]

## 2023-05-13 NOTE — Discharge Instructions (Signed)
Please follow-up with your outpatient provider.  Please return for any new, worsening, or change in symptoms or other concerns.  It was a pleasure caring for you today.

## 2023-05-13 NOTE — ED Provider Notes (Signed)
[facility] Provider Note    Event Date/Time   First MD Initiated Contact with Patient 05/13/23 1701     (approximate)   History   Vaginal Bleeding   HPI  Sophia Benitez is a 22 y.o. female who presents today for evaluation of heavy and painful menses.  Patient reports that her period began on 04/30/2023 and has persisted.  She reports that she has pain across her low abdomen that is cramping in nature.  She denies any other discharge.  She has not had any nausea or vomiting.  She denies history of pregnancy.  No fevers or chills.     Physical Exam   Triage Vital Signs: ED Triage Vitals [05/13/23 1556]  Encounter Vitals Group     BP 117/80     Systolic BP Percentile      Diastolic BP Percentile      Pulse Rate 62     Resp 18     Temp 98.5 F (36.9 C)     Temp src      SpO2 100 %     Weight 145 lb (65.8 kg)     Height 5\' 6"  (1.676 m)     Head Circumference      Peak Flow      Pain Score 5     Pain Loc      Pain Education      Exclude from Growth Chart     Most recent vital signs: Vitals:   05/13/23 1556  BP: 117/80  Pulse: 62  Resp: 18  Temp: 98.5 F (36.9 C)  SpO2: 100%    Physical Exam Vitals and nursing note reviewed.  Constitutional:      General: Awake and alert. No acute distress.    Appearance: Normal appearance. The patient is normal weight.  HENT:     Head: Normocephalic and atraumatic.     Mouth: Mucous membranes are moist.  Eyes:     General: PERRL. Normal EOMs        Right eye: No discharge.        Left eye: No discharge.     Conjunctiva/sclera: Conjunctivae normal.  Cardiovascular:     Rate and Rhythm: Normal rate and regular rhythm.     Pulses: Normal pulses.  Pulmonary:     Effort: Pulmonary effort is normal. No respiratory distress.     Breath sounds: Normal breath sounds.  Abdominal:     Abdomen is soft. There is no abdominal tenderness. No rebound or guarding. No distention. Musculoskeletal:         General: No swelling. Normal range of motion.     Cervical back: Normal range of motion and neck supple.  Skin:    General: Skin is warm and dry.     Capillary Refill: Capillary refill takes less than 2 seconds.     Findings: No rash.  Neurological:     Mental Status: The patient is awake and alert.      ED Results / Procedures / Treatments   Labs (all labs ordered are listed, but only abnormal results are displayed) Labs Reviewed  CBC - Abnormal; Notable for the following components:      Result Value   Hemoglobin 10.8 (*)    HCT 35.0 (*)    MCV 79.9 (*)    MCH 24.7 (*)    All other components within normal limits  URINALYSIS, ROUTINE W REFLEX MICROSCOPIC - Abnormal; Notable for the following components:  Color, Urine YELLOW (*)    APPearance HAZY (*)    Hgb urine dipstick MODERATE (*)    Ketones, ur 5 (*)    All other components within normal limits  BASIC METABOLIC PANEL  POC URINE PREG, ED     EKG     RADIOLOGY I independently reviewed and interpreted imaging and agree with radiologists findings.     PROCEDURES:  Critical Care performed:   Procedures   MEDICATIONS ORDERED IN ED: Medications  ketorolac (TORADOL) 15 MG/ML injection 15 mg (15 mg Intramuscular Not Given 05/13/23 1811)     IMPRESSION / MDM / ASSESSMENT AND PLAN / ED COURSE  I reviewed the triage vital signs and the nursing notes.   Differential diagnosis includes, but is not limited to, heavy menses, uterine fibroids, ovarian cyst, pregnancy.  Patient is awake and alert, hemodynamically stable and afebrile.  She is nontoxic in appearance.  She has no reproducible abdominal tenderness.  Labs obtained in triage are overall reassuring, stable H&H.  Urinalysis reveals moderate blood as expected, no evidence of infection.  Urine pregnancy is negative.  Ultrasound obtained for evaluation of etiology of bleeding including fibroids or cyst.  She was offered symptomatic management but  declined.  Ultrasound does not reveal any cyst, torsion, or other abnormalities besides a follicle in her right ovary.  Upon reevaluation, patient reports that her pain was resolved.  Recommended close outpatient follow-up with her OB/GYN if her symptoms persist.  Also discussed return precautions.  Patient understands and agrees with plan.  She was discharged in stable condition.   Patient's presentation is most consistent with acute complicated illness / injury requiring diagnostic workup.    FINAL CLINICAL IMPRESSION(S) / ED DIAGNOSES   Final diagnoses:  Vaginal bleeding     Rx / DC Orders   ED Discharge Orders     None        Note:  This document was prepared using Dragon voice recognition software and may include unintentional dictation errors.   Keturah Shavers 05/13/23 1924    Janith Lima, MD 05/13/23 [id number]

## 2023-05-16 ENCOUNTER — Encounter: Payer: Self-pay | Admitting: Nurse Practitioner

## 2023-05-16 ENCOUNTER — Telehealth (INDEPENDENT_AMBULATORY_CARE_PROVIDER_SITE_OTHER): Payer: Self-pay | Admitting: Nurse Practitioner

## 2023-05-16 VITALS — Ht 66.0 in | Wt 145.0 lb

## 2023-05-16 DIAGNOSIS — R252 Cramp and spasm: Secondary | ICD-10-CM | POA: Insufficient documentation

## 2023-05-16 MED ORDER — IBUPROFEN 800 MG PO TABS: 800.0000 mg | ORAL_TABLET | Freq: Three times a day (TID) | ORAL | 0 refills | Status: AC | PRN

## 2023-05-16 MED ORDER — CYCLOBENZAPRINE HCL 10 MG PO TABS: 10.0000 mg | ORAL_TABLET | Freq: Three times a day (TID) | ORAL | 0 refills | Status: AC | PRN

## 2023-05-16 NOTE — Progress Notes (Addendum)
Virtual Visit via Video Note  I connected with Celene Skeen on 05/16/23 at 4:11 PM by a video enabled telemedicine application and verified that I am speaking with the correct person using two identifiers.  Patient Location: Other:  Regulatory affairs officer Location: Office/Clinic  I discussed the limitations, risks, security, and privacy concerns of performing an evaluation and management service by video and the availability of in person appointments. I also discussed with the patient that there may be a patient responsible charge related to this service. The patient expressed understanding and agreed to proceed.  Subjective: PCP: Sherlene Shams, MD  Chief Complaint  Patient presents with   Dysmenorrhea    Pain travels to right leg 4-5 days   HPI  Pt is seen due to cramps that started 5 days ago.  Her period started on 04/30/23  and the bleeding stopped 2 days ago. She was seen at the ED on 05/13/23, pelvic US revealed no torsion or acute abnormality but had 1.4 cm dominant right ovarian follicle. She is schedule to see Obgyn on 05/21/23.   She has been experiencing cramps and pain in the right abdomen and back that radiates to the right leg. She has been taking Midol for abdominal pain but is not helping with her leg pain.   She would like something of cramps and pain control.    ROS: Per HPI  Current Outpatient Medications:    benzonatate (TESSALON) 200 MG capsule, Take 1 capsule (200 mg total) by mouth 3 (three) times daily as needed for cough., Disp: 20 capsule, Rfl: 0   cyclobenzaprine (FLEXERIL) 10 MG tablet, Take 1 tablet (10 mg total) by mouth 3 (three) times daily as needed for muscle spasms., Disp: 30 tablet, Rfl: 0   ibuprofen (ADVIL) 800 MG tablet, Take 1 tablet (800 mg total) by mouth every 8 (eight) hours as needed., Disp: 30 tablet, Rfl: 0   albuterol (PROVENTIL) (2.5 MG/3ML) 0.083% nebulizer solution, Take 2.5 mg by nebulization every 6 (six) hours as needed for  wheezing or shortness of breath. (Patient not taking: Reported on 05/16/2023), Disp: , Rfl:    albuterol (VENTOLIN HFA) 108 (90 Base) MCG/ACT inhaler, Inhale 1-2 puffs into the lungs every 6 (six) hours as needed for wheezing or shortness of breath. (Patient not taking: Reported on 05/16/2023), Disp: 18 g, Rfl: 11   rizatriptan (MAXALT-MLT) 5 MG disintegrating tablet, Take 1 tablet (5 mg total) by mouth as needed for migraine. May repeat in 2 hours if needed max dose 20 mg in 24 hours (4 pills) (Patient not taking: Reported on 05/16/2023), Disp: 10 tablet, Rfl: 0  Observations/Objective: Today's Vitals   05/16/23 1552  Weight: 145 lb (65.8 kg)  Height: 5\' 6"  (1.676 m)   Physical Exam Constitutional:      General: She is not in acute distress.    Appearance: Normal appearance. She is not ill-appearing.  Eyes:     Conjunctiva/sclera: Conjunctivae normal.  Pulmonary:     Effort: No respiratory distress.  Neurological:     Mental Status: She is alert.  Psychiatric:        Mood and Affect: Mood normal.        Behavior: Behavior normal.        Thought Content: Thought content normal.        Judgment: Judgment normal.     Assessment and Plan: Muscle cramps Assessment & Plan: Will treat with Flexeril and ibuprofen. Advised patient to not take over-the-counter NSAIDs with  ibuprofen. Medication side effects discussed. Advised to use warm compress.  If symptoms persist or worsen please follow-up.   Other orders -     Cyclobenzaprine HCl; Take 1 tablet (10 mg total) by mouth 3 (three) times daily as needed for muscle spasms.  Dispense: 30 tablet; Refill: 0 -     Ibuprofen; Take 1 tablet (800 mg total) by mouth every 8 (eight) hours as needed.  Dispense: 30 tablet; Refill: 0    Follow Up Instructions: No follow-ups on file.   I discussed the assessment and treatment plan with the patient. The patient was provided an opportunity to ask questions, and all were answered. The patient agreed  with the plan and demonstrated an understanding of the instructions.   The patient was advised to call back or seek an in-person evaluation if the symptoms worsen or if the condition fails to improve as anticipated.  The above assessment and management plan was discussed with the patient. The patient verbalized understanding of and has agreed to the management plan.   Kara Dies, NP

## 2023-05-16 NOTE — Assessment & Plan Note (Signed)
Will treat with Flexeril and ibuprofen. Advised patient to not take over-the-counter NSAIDs with ibuprofen. Medication side effects discussed. Advised to use warm compress.  If symptoms persist or worsen please follow-up.

## 2023-05-21 ENCOUNTER — Ambulatory Visit (INDEPENDENT_AMBULATORY_CARE_PROVIDER_SITE_OTHER): Payer: 59 | Admitting: Obstetrics

## 2023-05-21 ENCOUNTER — Encounter: Payer: Self-pay | Admitting: Obstetrics

## 2023-05-21 VITALS — BP 120/80 | Ht 65.0 in | Wt 146.0 lb

## 2023-05-21 DIAGNOSIS — N939 Abnormal uterine and vaginal bleeding, unspecified: Secondary | ICD-10-CM | POA: Diagnosis not present

## 2023-05-21 NOTE — Progress Notes (Signed)
    GYNECOLOGY PROGRESS NOTE  Subjective:    Patient ID: Sophia Benitez, female    DOB: May 03, 2001, 22 y.o.   MRN: [id number]  HPI  Patient is a 22 y.o. G0P0 who presents for ER follow up for heavy and painful menses. Pt seen at [facility] ED on 05/13/23. Her period began on 04/30/2023 and stopped on Sep 26th. She reports that she had painful cramping across her low abdomen that resolved in the ED. She was worried about how heavy and long she had been bleeding. US done in the ED was unremarkable with exception of a "1.4 cm dominant right ovarian follicle." H/H were normal, and UPT was neg and her pain resolved while in ED.    This was the first time her period had been this heavy or prolonged. LMP 05/13/23, cycles are regular Q28-30 days and periods typically last 5 days, flow is sometimes heavy, sometimes moderate to light. Is not currently taking any contraception. She denies further bleeding or pain, no vaginal symptoms. She has not had any nausea or vomiting. She denies history of pregnancy, but is trying to get pregnant soon, not taking a prenatal. No fevers or chills.   The following portions of the patient's history were reviewed and updated as appropriate: allergies, current medications, past family history, past medical history, past social history, past surgical history, and problem list.  Review of Systems Pertinent items are noted in HPI.   Objective:   Vitals:   05/21/23 1540  BP: 120/80    Last menstrual period 05/13/2023. Body mass index is 24.3 kg/m. General appearance: alert and cooperative Respiratory: normal work of breathing. Extremities: extremities normal, atraumatic, no cyanosis or edema Neurologic: Grossly normal  Assessment:   1. Abnormal uterine bleeding (AUB)      Plan:   22yo G0P0 here today, following up from an ED visit for her first abnormal period, much heavier and longer than usual. Workup was reassuring and unremarkable, discussed the ED results together  and reassurance given. Because this is her first abnormal period, and pt is trying to conceive, will watch and wait. Start using period tracker in phone and encouraged pt to start prenatal vitamins now. RTC in 1-2 mos for Well Woman Exam, sooner prn.   Julieanne Manson, DO [facility]

## 2023-05-22 ENCOUNTER — Telehealth: Payer: Self-pay | Admitting: Obstetrics

## 2023-05-22 NOTE — Telephone Encounter (Signed)
Reached out to pt to schedule Annual/Well Woman Exam with Dr. Lonny Prude in about 1 month.  Left message for pt to call back to schedule.

## 2023-06-18 ENCOUNTER — Ambulatory Visit: Payer: 59 | Admitting: Obstetrics

## 2023-10-20 ENCOUNTER — Telehealth: Payer: Self-pay | Admitting: Obstetrics

## 2023-10-20 NOTE — Telephone Encounter (Signed)
 The patient was scheduled for Thu, 3/6 at 2:35 pm with Dr Lonny Prude. Due to scheduling adjustment the patient is now scheduled for 2:55 pm. I left message with the patient.

## 2023-10-21 NOTE — Telephone Encounter (Signed)
 Contacted the patient via phone x1. Left message for the patient to contact our office for scheduling adjustment.

## 2023-10-23 ENCOUNTER — Ambulatory Visit: Payer: Commercial Managed Care - PPO | Admitting: Obstetrics

## 2023-10-23 ENCOUNTER — Encounter: Payer: Self-pay | Admitting: Obstetrics

## 2023-10-23 ENCOUNTER — Ambulatory Visit: Admitting: Obstetrics

## 2023-10-23 VITALS — BP 118/70 | Ht 66.0 in | Wt 149.0 lb

## 2023-10-23 DIAGNOSIS — N920 Excessive and frequent menstruation with regular cycle: Secondary | ICD-10-CM | POA: Diagnosis not present

## 2023-10-23 DIAGNOSIS — Z30011 Encounter for initial prescription of contraceptive pills: Secondary | ICD-10-CM | POA: Diagnosis not present

## 2023-10-23 DIAGNOSIS — Z3009 Encounter for other general counseling and advice on contraception: Secondary | ICD-10-CM | POA: Diagnosis not present

## 2023-10-23 MED ORDER — LEVONORGEST-ETH ESTRAD 91-DAY 0.15-0.03 &0.01 MG PO TABS: 1.0000 | ORAL_TABLET | Freq: Every day | ORAL | 4 refills | Status: AC

## 2023-10-23 NOTE — Progress Notes (Signed)
    GYNECOLOGY PROGRESS NOTE  Subjective:  PCP: Sherlene Shams, MD  Patient ID: Sophia Benitez, female    DOB: 2000-09-17, 23 y.o.   MRN: [id number]  HPI  Patient is a 23 y.o. G0P0000 female who presents for heavy, long periods and wants birth control to help . Last unprotected intercourse was 10/18/23. Has not been on hormonal contraception before. Also desires to prevent pregnancy. Was seen 05/21/23 and was trying to conceive at the time, but now desires to prevent. Denies smoking, hx of VTE, and does have migraines but without aura.     Period Cycle (Days) 30  Period Duration (Days) 14-18  Period Pattern Regular  Menstrual Flow Heavy  Menstrual Control Tampon; Thin pad  Menstrual Control Change Freq (Hours) 2-3  Dysmenorrhea None   The following portions of the patient's history were reviewed and updated as appropriate: allergies, current medications, past family history, past medical history, past social history, past surgical history, and problem list.  Review of Systems Pertinent items are noted in HPI.   Objective:   Height 5\' 6"  (1.676 m), weight 149 lb (67.6 kg), last menstrual period 09/29/2023. Body mass index is 24.05 kg/m.  Physical Exam Vitals and nursing note reviewed.  Constitutional:      Appearance: Normal appearance. She is normal weight.  HENT:     Head: Normocephalic and atraumatic.  Eyes:     Extraocular Movements: Extraocular movements intact.  Pulmonary:     Effort: Pulmonary effort is normal.  Musculoskeletal:        General: Normal range of motion.  Neurological:     General: No focal deficit present.     Mental Status: She is alert.    Assessment/Plan:   1. Menorrhagia with regular cycle   2. Encounter for counseling regarding contraception   3. Encounter for oral contraception initial prescription   Sophia Benitez is a 23 y.o.. G0P0000 with HMB and desiring contraception.  -Different birth control methods including LARCs  discussed/offered. -Urine HCG negative today. -Start oral contraceptives as prescribed: Seasonique/ periods Q39mos -Cardiovascular and other risks discussed, aware that smoking while on OCPs increases the risk of blood clots.  -SEs discussed, including BTB, N&V, weight changes, increased moodiness. If symptoms are severe, or mild SEs do not improve after 3 mos, call or RTC to discuss options.  -Refrain from intercourse or use barrier contraception x 7 days after starting OCPs.  -Aware that OCPs protect against pregnancy only, not STDs. -Follow up in 3 months, sooner prn.    Sophia Manson, DO [facility]

## 2024-02-11 ENCOUNTER — Other Ambulatory Visit: Payer: Self-pay

## 2024-02-11 ENCOUNTER — Encounter: Payer: Self-pay | Admitting: Pharmacist

## 2024-02-11 ENCOUNTER — Ambulatory Visit

## 2024-02-11 VITALS — BP 106/56 | HR 75 | Wt 146.2 lb

## 2024-02-11 DIAGNOSIS — H11432 Conjunctival hyperemia, left eye: Secondary | ICD-10-CM | POA: Insufficient documentation

## 2024-02-11 MED ORDER — OFLOXACIN 0.3 % OP SOLN: 1.0000 [drp] | Freq: Four times a day (QID) | OPHTHALMIC | 0 refills | Status: DC

## 2024-02-11 MED ORDER — CARBOXYMETHYLCELLULOSE SODIUM 1 % OP SOLN
1.0000 [drp] | Freq: Three times a day (TID) | OPHTHALMIC | 1 refills | Status: AC
  Filled 2024-02-11: qty 30, fill #0

## 2024-02-11 MED ORDER — CARBOXYMETHYLCELLULOSE SODIUM 1 % OP SOLN: 1.0000 [drp] | Freq: Three times a day (TID) | OPHTHALMIC | 1 refills | Status: DC

## 2024-02-11 MED ORDER — OFLOXACIN 0.3 % OP SOLN
1.0000 [drp] | Freq: Four times a day (QID) | OPHTHALMIC | 0 refills | Status: AC
  Filled 2024-02-11 (×2): qty 5, 25d supply, fill #0

## 2024-02-11 NOTE — Progress Notes (Signed)
   Acute Office Visit  Subjective:    Patient ID: Sophia Benitez, female    DOB: [birth year], 23 y.o.   MRN: [id number]  Chief Complaint  Patient presents with   Conjunctivitis   Patient is in today for following acute concern: HPI:  Patient presenting with 2 days history of left eye redness, burning sensation. She developed light sensitivity, clear discharge from left eye for 1 day. She is also endorsing of headache for about the same time. She denies fever, chills, nausea, vomiting, recent antibiotic use, h/o autoimmune disease in her. She was in a pool prior to symptom onset. Wears contact lens but has not been since symptom onset.  No URI like symptoms.     ROS As per HPI    Objective:    BP (!) 106/56 (BP Location: Right Arm, Patient Position: Sitting, Cuff Size: Normal)   Pulse 75   Wt 146 lb 3.2 oz (66.3 kg)   SpO2 99%   BMI 23.60 kg/m    Physical Exam HENT:     Head: Normocephalic and atraumatic.   Eyes:     General: Lids are normal. Lids are everted, no foreign bodies appreciated.     Extraocular Movements:     Right eye: Normal extraocular motion and no nystagmus.     Left eye: Normal extraocular motion and no nystagmus.     Conjunctiva/sclera:     Left eye: Left conjunctiva is injected. No exudate.    Comments: Pupil equal, 3 mm and reactive b/l    Cardiovascular:     Rate and Rhythm: Normal rate.  Pulmonary:     Effort: Pulmonary effort is normal.     Breath sounds: No wheezing or rales.  Abdominal:     Palpations: Abdomen is soft.  Lymphadenopathy:     Cervical: No cervical adenopathy.   Neurological:     Mental Status: She is alert.    No results found for any visits on 02/11/24.     Assessment & Plan:  Conjunctival hyperemia of left eye Assessment & Plan: D/D includes infectious, inflammatory conjunctivitis, corneal abrasion, uveitis. Recommend prophylactic antibiotic Ofloxacin 1-2 drop to left eye four times a day for 5-7 days. Preservative  free eye drop 1 drop 3-4 times a day as needed. Urgent ophthalmology evaluation recommended. Patient has appointment with [facility] on 02/12/24 at 10:30 AM. Refrain from wearing contact lenses and avoid eye makeup till redness is fully resolved.   Orders: -     Carboxymethylcellulose Sodium; Apply 1 drop to eye 3 (three) times daily.  Dispense: 30 mL; Refill: 1 -     Ofloxacin; Place 1 drop into the left eye 4 (four) times daily.  Dispense: 5 mL; Refill: 0    Return if symptoms worsen or fail to improve.  Luke Shade, MD

## 2024-02-11 NOTE — Assessment & Plan Note (Addendum)
 D/D includes infectious, inflammatory conjunctivitis, corneal abrasion, glaucoma, uveitis. Recommend prophylactic antibiotic Ofloxacin 1-2 drop to left eye four times a day for 5-7 days. Preservative free eye drop 1 drop 3-4 times a day as needed. Urgent ophthalmology evaluation recommended. Patient has appointment with [facility] on 02/12/24 at 10:30 AM. Refrain from wearing contact lenses and avoid eye makeup till redness is fully resolved.

## 2024-02-12 ENCOUNTER — Other Ambulatory Visit: Payer: Self-pay

## 2024-02-12 DIAGNOSIS — B309 Viral conjunctivitis, unspecified: Secondary | ICD-10-CM | POA: Diagnosis not present

## 2024-02-12 DIAGNOSIS — H16002 Unspecified corneal ulcer, left eye: Secondary | ICD-10-CM | POA: Diagnosis not present

## 2024-02-12 MED ORDER — TOBRAMYCIN-DEXAMETHASONE 0.3-0.1 % OP SUSP
1.0000 [drp] | Freq: Four times a day (QID) | OPHTHALMIC | 0 refills | Status: AC
  Filled 2024-02-12: qty 5, 25d supply, fill #0

## 2024-02-18 DIAGNOSIS — B309 Viral conjunctivitis, unspecified: Secondary | ICD-10-CM | POA: Diagnosis not present

## 2024-02-18 DIAGNOSIS — H16002 Unspecified corneal ulcer, left eye: Secondary | ICD-10-CM | POA: Diagnosis not present

## 2024-04-29 DIAGNOSIS — F411 Generalized anxiety disorder: Secondary | ICD-10-CM | POA: Diagnosis not present

## 2024-05-20 DIAGNOSIS — F411 Generalized anxiety disorder: Secondary | ICD-10-CM | POA: Diagnosis not present

## 2024-09-20 ENCOUNTER — Emergency Department
Admission: EM | Admit: 2024-09-20 | Discharge: 2024-09-21 | Disposition: A | Attending: Emergency Medicine | Admitting: Emergency Medicine

## 2024-09-20 ENCOUNTER — Encounter: Payer: Self-pay | Admitting: *Deleted

## 2024-09-20 ENCOUNTER — Other Ambulatory Visit: Payer: Self-pay

## 2024-09-20 DIAGNOSIS — H53149 Visual discomfort, unspecified: Secondary | ICD-10-CM

## 2024-09-20 DIAGNOSIS — J45909 Unspecified asthma, uncomplicated: Secondary | ICD-10-CM | POA: Insufficient documentation

## 2024-09-20 DIAGNOSIS — G43801 Other migraine, not intractable, with status migrainosus: Secondary | ICD-10-CM | POA: Insufficient documentation

## 2024-09-20 MED ORDER — DEXAMETHASONE SOD PHOSPHATE PF 10 MG/ML IJ SOLN
10.0000 mg | Freq: Once | INTRAMUSCULAR | Status: AC
  Administered 2024-09-21: 10 mg via INTRAVENOUS
  Filled 2024-09-20: qty 1

## 2024-09-20 MED ORDER — DIPHENHYDRAMINE HCL 50 MG/ML IJ SOLN
50.0000 mg | Freq: Once | INTRAMUSCULAR | Status: AC
  Administered 2024-09-21: 50 mg via INTRAMUSCULAR
  Filled 2024-09-20: qty 1

## 2024-09-20 MED ORDER — MAGNESIUM SULFATE 2 GM/50ML IV SOLN
2.0000 g | Freq: Once | INTRAVENOUS | Status: AC
  Administered 2024-09-21: 2 g via INTRAVENOUS
  Filled 2024-09-20: qty 50

## 2024-09-20 MED ORDER — SODIUM CHLORIDE 0.9 % IV BOLUS
1000.0000 mL | Freq: Once | INTRAVENOUS | Status: AC
  Administered 2024-09-21: 1000 mL via INTRAVENOUS

## 2024-09-20 MED ORDER — METOCLOPRAMIDE HCL 5 MG/ML IJ SOLN
10.0000 mg | Freq: Once | INTRAMUSCULAR | Status: AC
  Administered 2024-09-21: 10 mg via INTRAVENOUS
  Filled 2024-09-20: qty 2

## 2024-09-20 MED ORDER — KETOROLAC TROMETHAMINE 30 MG/ML IJ SOLN
30.0000 mg | Freq: Once | INTRAMUSCULAR | Status: AC
  Administered 2024-09-21: 30 mg via INTRAVENOUS
  Filled 2024-09-20: qty 1

## 2024-09-21 MED ORDER — BUTALBITAL-APAP-CAFFEINE 50-325-40 MG PO TABS: 1.0000 | ORAL_TABLET | Freq: Four times a day (QID) | ORAL | 0 refills | Status: AC | PRN
